# Patient Record
Sex: Male | Born: 1965 | Race: Black or African American | Hispanic: No | Smoking: Never smoker
Health system: Southern US, Community
[De-identification: ages and names within clinical notes are randomized; demographics above are authoritative.]

## PROBLEM LIST (undated history)

## (undated) DIAGNOSIS — B20 Human immunodeficiency virus [HIV] disease: Secondary | ICD-10-CM

## (undated) DIAGNOSIS — Z21 Asymptomatic human immunodeficiency virus [HIV] infection status: Secondary | ICD-10-CM

## (undated) HISTORY — PX: SKIN SURGERY: SHX2413

---

## 2004-12-31 ENCOUNTER — Emergency Department: Payer: Self-pay | Admitting: Emergency Medicine

## 2005-01-10 ENCOUNTER — Emergency Department: Payer: Self-pay | Admitting: Unknown Physician Specialty

## 2007-12-24 ENCOUNTER — Emergency Department: Payer: Self-pay | Admitting: Emergency Medicine

## 2010-10-27 ENCOUNTER — Ambulatory Visit: Payer: Self-pay | Admitting: Internal Medicine

## 2011-04-28 DIAGNOSIS — A630 Anogenital (venereal) warts: Secondary | ICD-10-CM | POA: Insufficient documentation

## 2012-11-21 DIAGNOSIS — E1121 Type 2 diabetes mellitus with diabetic nephropathy: Secondary | ICD-10-CM | POA: Insufficient documentation

## 2012-11-21 DIAGNOSIS — K6282 Dysplasia of anus: Secondary | ICD-10-CM | POA: Insufficient documentation

## 2012-11-21 DIAGNOSIS — I10 Essential (primary) hypertension: Secondary | ICD-10-CM | POA: Insufficient documentation

## 2012-11-21 DIAGNOSIS — B20 Human immunodeficiency virus [HIV] disease: Secondary | ICD-10-CM | POA: Insufficient documentation

## 2012-11-21 DIAGNOSIS — R748 Abnormal levels of other serum enzymes: Secondary | ICD-10-CM | POA: Insufficient documentation

## 2012-11-21 DIAGNOSIS — E669 Obesity, unspecified: Secondary | ICD-10-CM | POA: Insufficient documentation

## 2012-11-21 DIAGNOSIS — E785 Hyperlipidemia, unspecified: Secondary | ICD-10-CM | POA: Insufficient documentation

## 2012-11-27 DIAGNOSIS — Z0000 Encounter for general adult medical examination without abnormal findings: Secondary | ICD-10-CM | POA: Insufficient documentation

## 2015-04-26 DIAGNOSIS — N529 Male erectile dysfunction, unspecified: Secondary | ICD-10-CM | POA: Insufficient documentation

## 2015-10-29 DIAGNOSIS — E538 Deficiency of other specified B group vitamins: Secondary | ICD-10-CM | POA: Insufficient documentation

## 2016-10-04 DIAGNOSIS — H35033 Hypertensive retinopathy, bilateral: Secondary | ICD-10-CM | POA: Insufficient documentation

## 2017-03-01 ENCOUNTER — Encounter: Payer: Self-pay | Admitting: Urology

## 2017-03-01 ENCOUNTER — Ambulatory Visit (INDEPENDENT_AMBULATORY_CARE_PROVIDER_SITE_OTHER): Payer: BC Managed Care – PPO | Admitting: Urology

## 2017-03-01 VITALS — BP 129/79 | HR 102 | Ht 70.0 in | Wt 265.0 lb

## 2017-03-01 DIAGNOSIS — E291 Testicular hypofunction: Secondary | ICD-10-CM

## 2017-03-01 DIAGNOSIS — N529 Male erectile dysfunction, unspecified: Secondary | ICD-10-CM

## 2017-03-01 MED ORDER — SILDENAFIL CITRATE 20 MG PO TABS: ORAL_TABLET | ORAL | 2 refills | Status: DC

## 2017-03-01 MED ORDER — TESTOSTERONE CYPIONATE 200 MG/ML IM SOLN: 100.0000 mg | INTRAMUSCULAR | 0 refills | Status: DC

## 2017-03-01 NOTE — Progress Notes (Signed)
03/01/2017 2:34 PM   George Bush 1966/04/02 [id number]   Chief Complaint  Patient presents with  . Erectile Dysfunction    HPI: 51 year old male presents for follow-up of erectile dysfunction and hypogonadism.  He was last seen by me at [facility] in May 2018.  He is currently injecting 100 mg of testosterone weekly.  He gave himself an injection today.  He has noted improvement in his energy level and libido.  He is also taking generic sildenafil with improvement in his ED.  Denies breast tenderness/enlargement or lower extremity edema.  He has no bothersome lower urinary tract symptoms.    PMH: History reviewed. No pertinent past medical history.  Surgical History: History reviewed. No pertinent surgical history.  Home Medications:  Allergies as of 03/01/2017   No Known Allergies     Medication List        Accurate as of 03/01/17  2:34 PM. Always use your most recent med list.          aspirin EC 81 MG tablet Take 81 mg by mouth.   atorvastatin 40 MG tablet Commonly known as:  LIPITOR TAKE ONE TABLET EVERY DAY   elvitegravir-cobicistat-emtricitabine-tenofovir 150-150-200-10 MG Tabs tablet Commonly known as:  GENVOYA Take by mouth.   empagliflozin 25 MG Tabs tablet Commonly known as:  JARDIANCE Take 25 mg by mouth.   Insulin Degludec-Liraglutide 100-3.6 UNIT-MG/ML Sopn Inject into the skin.   lisinopril-hydrochlorothiazide 20-25 MG tablet Commonly known as:  PRINZIDE,ZESTORETIC Take by mouth.   metFORMIN 1000 MG tablet Commonly known as:  GLUCOPHAGE TAKE 1 TABLET BY MOUTH TWICE DAILY WITH A MEAL   naproxen 500 MG tablet Commonly known as:  NAPROSYN   sildenafil 20 MG tablet Commonly known as:  REVATIO 3-5 tablets 1 hour prior to intercourse   testosterone cypionate 200 MG/ML injection Commonly known as:  DEPOTESTOSTERONE CYPIONATE Inject every 14 (fourteen) days into the muscle.       Allergies: No Known Allergies  Family  History: History reviewed. No pertinent family history.  Social History:  reports that  has never smoked. he has never used smokeless tobacco. He reports that he does not drink alcohol or use drugs.  ROS: UROLOGY Frequent Urination?: No Hard to postpone urination?: No Burning/pain with urination?: No Get up at night to urinate?: No Leakage of urine?: No Urine stream starts and stops?: No Trouble starting stream?: No Do you have to strain to urinate?: No Blood in urine?: No Urinary tract infection?: No Sexually transmitted disease?: No Injury to kidneys or bladder?: No Painful intercourse?: No Weak stream?: No Erection problems?: Yes Penile pain?: No  Gastrointestinal Nausea?: No Vomiting?: No Indigestion/heartburn?: No Diarrhea?: No Constipation?: No  Constitutional Fever: No Night sweats?: No Weight loss?: No Fatigue?: No  Skin Skin rash/lesions?: No Itching?: No  Eyes Blurred vision?: No Double vision?: No  Ears/Nose/Throat Sore throat?: No Sinus problems?: No  Hematologic/Lymphatic Swollen glands?: No Easy bruising?: No  Cardiovascular Leg swelling?: No Chest pain?: No  Respiratory Cough?: No Shortness of breath?: No  Endocrine Excessive thirst?: No  Musculoskeletal Back pain?: No Joint pain?: No  Neurological Headaches?: No Dizziness?: No  Psychologic Depression?: No Anxiety?: No  Physical Exam: BP 129/79   Pulse (!) 102   Ht 5\' 10"  (1.778 m)   Wt 265 lb (120.2 kg)   BMI 38.02 kg/m   Constitutional:  Alert and oriented, No acute distress. HEENT: [place] AT, moist mucus membranes.  Trachea midline, no masses. Cardiovascular: No clubbing, cyanosis,  or edema. Respiratory: Normal respiratory effort, no increased work of breathing. GI: Abdomen is soft, nontender, nondistended, no abdominal masses GU: No CVA tenderness.  Prostate 40 g, smooth without nodules Skin: No rashes, bruises or suspicious lesions. Lymph: No cervical or  inguinal adenopathy. Neurologic: Grossly intact, no focal deficits, moving all 4 extremities. Psychiatric: Normal mood and affect.    Assessment & Plan:   1. Hypogonadism in male Currently doing well on testosterone replacement.  Monitoring blood work was drawn including PSA, testosterone and hematocrit.  Testosterone was refilled.  If stable he will follow-up in 6 months.  - PSA - Testosterone - Hematocrit  2.  Erectile dysfunction  Sildenafil was refilled.    Riki AltesScott C Gabriella Guile, MD  [facility] [address], [address] [place], [place] [address] [phone]

## 2017-03-02 LAB — TESTOSTERONE: TESTOSTERONE: 403 ng/dL (ref 264–916)

## 2017-03-02 LAB — PSA: PROSTATE SPECIFIC AG, SERUM: 0.7 ng/mL (ref 0.0–4.0)

## 2017-03-02 LAB — HEMATOCRIT: Hematocrit: 47.9 % (ref 37.5–51.0)

## 2017-03-08 ENCOUNTER — Telehealth: Payer: Self-pay

## 2017-03-08 NOTE — Telephone Encounter (Signed)
-----   Message from Riki AltesScott C Stoioff, MD sent at 03/04/2017  1:38 PM EST ----- Lab work looks good.  Testosterone level looks good at 403; PSA normal at 0.7 and hematocrit was normal.

## 2017-03-08 NOTE — Telephone Encounter (Signed)
LMOM- labs are normal. 

## 2017-08-29 ENCOUNTER — Ambulatory Visit: Payer: BC Managed Care – PPO | Admitting: Urology

## 2017-08-31 ENCOUNTER — Ambulatory Visit: Payer: BC Managed Care – PPO | Admitting: Urology

## 2017-10-10 ENCOUNTER — Ambulatory Visit (INDEPENDENT_AMBULATORY_CARE_PROVIDER_SITE_OTHER): Payer: BC Managed Care – PPO | Admitting: Urology

## 2017-10-10 ENCOUNTER — Encounter: Payer: Self-pay | Admitting: Urology

## 2017-10-10 VITALS — BP 122/77 | HR 98 | Resp 16 | Ht 71.0 in | Wt 269.5 lb

## 2017-10-10 DIAGNOSIS — E291 Testicular hypofunction: Secondary | ICD-10-CM | POA: Diagnosis not present

## 2017-10-10 DIAGNOSIS — N5201 Erectile dysfunction due to arterial insufficiency: Secondary | ICD-10-CM | POA: Insufficient documentation

## 2017-10-10 NOTE — Progress Notes (Signed)
10/10/2017 12:12 PM   George Bush 1965/11/10 [id number]  Referring provider: Gale Journey, MD [address] [place], [place] [address]  Chief Complaint  Patient presents with  . Follow-up    HPI: 52 year old male presents for follow-up of erectile dysfunction and hypogonadism.  He was last seen in November 2018.  He was injecting 100 mg weekly however states he discontinued testosterone replacement approximately 3 months ago as his pharmacy told him that we had discontinued the medication.  There is no indication in the record that it was recommended to discontinue TRT.  He remains on generic sildenafil with moderate improvement in his ED.   PMH: History reviewed. No pertinent past medical history.  Surgical History: History reviewed. No pertinent surgical history.  Home Medications:  Allergies as of 10/10/2017   No Known Allergies     Medication List        Accurate as of 10/10/17 12:12 PM. Always use your most recent med list.          aspirin EC 81 MG tablet Take 81 mg by mouth.   atorvastatin 40 MG tablet Commonly known as:  LIPITOR TAKE ONE TABLET EVERY DAY   elvitegravir-cobicistat-emtricitabine-tenofovir 150-150-200-10 MG Tabs tablet Commonly known as:  GENVOYA Take by mouth.   empagliflozin 25 MG Tabs tablet Commonly known as:  JARDIANCE Take 25 mg by mouth.   Insulin Degludec-Liraglutide 100-3.6 UNIT-MG/ML Sopn Inject into the skin.   lisinopril-hydrochlorothiazide 20-25 MG tablet Commonly known as:  PRINZIDE,ZESTORETIC Take by mouth.   metFORMIN 1000 MG tablet Commonly known as:  GLUCOPHAGE TAKE 1 TABLET BY MOUTH TWICE DAILY WITH A MEAL   naproxen 500 MG tablet Commonly known as:  NAPROSYN   sildenafil 20 MG tablet Commonly known as:  REVATIO 3-5 tablets 1 hour prior to intercourse       Allergies: No Known Allergies  Family History: History reviewed. No pertinent family history.  Social History:   reports that he has never smoked. He has never used smokeless tobacco. He reports that he does not drink alcohol or use drugs.  ROS: UROLOGY Frequent Urination?: No Hard to postpone urination?: No Burning/pain with urination?: No Get up at night to urinate?: No Leakage of urine?: No Urine stream starts and stops?: No Trouble starting stream?: No Do you have to strain to urinate?: No Blood in urine?: No Urinary tract infection?: No Sexually transmitted disease?: No Injury to kidneys or bladder?: No Painful intercourse?: No Weak stream?: No Erection problems?: Yes Penile pain?: No  Gastrointestinal Nausea?: No Vomiting?: No Indigestion/heartburn?: No Diarrhea?: No Constipation?: No  Constitutional Fever: No Night sweats?: No Weight loss?: No Fatigue?: No  Skin Skin rash/lesions?: No Itching?: No  Eyes Blurred vision?: No Double vision?: No  Ears/Nose/Throat Sore throat?: No Sinus problems?: No  Hematologic/Lymphatic Swollen glands?: No Easy bruising?: No  Cardiovascular Leg swelling?: No Chest pain?: No  Respiratory Cough?: No Shortness of breath?: No  Endocrine Excessive thirst?: No  Musculoskeletal Back pain?: No Joint pain?: No  Neurological Headaches?: No Dizziness?: No  Psychologic Depression?: No Anxiety?: No  Physical Exam: BP 122/77   Pulse 98   Resp 16   Ht 5\' 11"  (1.803 m)   Wt 269 lb 8 oz (122.2 kg)   SpO2 96%   BMI 37.59 kg/m   Constitutional:  Alert and oriented, No acute distress. HEENT: [place] AT, moist mucus membranes.  Trachea midline, no masses. Cardiovascular: No clubbing, cyanosis, or edema. Respiratory: Normal respiratory  effort, no increased work of breathing. GI: Abdomen is soft, nontender, nondistended, no abdominal masses GU: No CVA tenderness Lymph: No cervical or inguinal lymphadenopathy. Skin: No rashes, bruises or suspicious lesions. Neurologic: Grossly intact, no focal deficits, moving all 4  extremities. Psychiatric: Normal mood and affect.    Assessment & Plan:   52 year old male with erectile dysfunction and hypogonadism.  Will recheck his testosterone level and of low he was offered to go back on TRT.  Continue sildenafil.  He indicated he did not need a refill.   Riki AltesScott C Arlis Yale, MD  [facility] [address], [address] [place], [place] [phone]

## 2017-10-11 ENCOUNTER — Telehealth: Payer: Self-pay

## 2017-10-11 LAB — TESTOSTERONE: TESTOSTERONE: 124 ng/dL — AB (ref 264–916)

## 2017-10-11 NOTE — Telephone Encounter (Signed)
-----   Message from Riki AltesScott C Stoioff, MD sent at 10/11/2017  7:48 AM EDT ----- Testosterone level was low at 124.  Does he want to go back on testosterone replacement?

## 2017-10-11 NOTE — Telephone Encounter (Signed)
Called pt informed him of the information below. Pt gave verbal understanding, states that he would like to resume testosterone replacement. [facility].

## 2017-10-13 MED ORDER — TESTOSTERONE CYPIONATE 200 MG/ML IM SOLN: 100.0000 mg | INTRAMUSCULAR | 0 refills | Status: DC

## 2017-10-13 NOTE — Telephone Encounter (Signed)
Called pt, no answer. LM for pt informing him of the information below. Advised pt to call back for any questions or concerns.

## 2017-10-13 NOTE — Telephone Encounter (Signed)
Rx was sent.  Recommend a testosterone level in 6 to 8 weeks after he resumes therapy.

## 2017-10-25 ENCOUNTER — Other Ambulatory Visit: Payer: Self-pay | Admitting: Family Medicine

## 2017-10-26 ENCOUNTER — Other Ambulatory Visit: Payer: Self-pay | Admitting: Family Medicine

## 2017-11-04 ENCOUNTER — Ambulatory Visit (INDEPENDENT_AMBULATORY_CARE_PROVIDER_SITE_OTHER): Payer: BC Managed Care – PPO | Admitting: Family Medicine

## 2017-11-04 DIAGNOSIS — E291 Testicular hypofunction: Secondary | ICD-10-CM

## 2017-11-04 MED ORDER — TESTOSTERONE CYPIONATE 200 MG/ML IM SOLN
200.0000 mg | Freq: Once | INTRAMUSCULAR | Status: AC
  Administered 2017-11-04: 200 mg via INTRAMUSCULAR

## 2017-11-04 NOTE — Progress Notes (Signed)
Testosterone IM Injection  Due to Hypogonadism patient is present today for a Testosterone Injection.  Medication: Testosterone Cypionate Dose: 0.5ML Location: right upper outer buttocks Lot: [id number] Exp:07/2018  Patient tolerated well, no complications were noted  Preformed by: Teressa Lowerarrie Mattox Schorr, CMA  Follow up: 1 week

## 2017-11-07 ENCOUNTER — Other Ambulatory Visit: Payer: Self-pay | Admitting: Urology

## 2017-11-07 MED ORDER — SILDENAFIL CITRATE 20 MG PO TABS: ORAL_TABLET | ORAL | 2 refills | Status: DC

## 2017-11-14 ENCOUNTER — Other Ambulatory Visit: Payer: Self-pay

## 2017-11-14 ENCOUNTER — Ambulatory Visit (INDEPENDENT_AMBULATORY_CARE_PROVIDER_SITE_OTHER): Payer: BC Managed Care – PPO

## 2017-11-14 DIAGNOSIS — E291 Testicular hypofunction: Secondary | ICD-10-CM

## 2017-11-14 MED ORDER — TESTOSTERONE CYPIONATE 200 MG/ML IM SOLN
100.0000 mg | Freq: Once | INTRAMUSCULAR | Status: AC
  Administered 2017-11-14: 100 mg via INTRAMUSCULAR

## 2017-11-14 NOTE — Progress Notes (Signed)
Testosterone IM Injection  Due to Hypogonadism patient is present today for a Testosterone Injection.  Medication: Testosterone Cypionate Dose: 0.595ml Location: left upper outer buttocks Lot: [id number] Exp:07/2018  Patient tolerated well, no complications were noted  Preformed by: Nydia Boutonhristopher Valente Fosberg, CMA  Follow up: 1 week

## 2017-11-21 ENCOUNTER — Other Ambulatory Visit: Payer: Self-pay

## 2017-11-21 ENCOUNTER — Ambulatory Visit: Payer: BC Managed Care – PPO

## 2017-11-21 NOTE — Progress Notes (Signed)
Pt came in office today for nurse visit to receive his testosterone injection. Pt is currently out of medication in office and did not bring any with him. He will come in tomorrow with new doses and receive his injection at that time.

## 2017-11-22 ENCOUNTER — Ambulatory Visit (INDEPENDENT_AMBULATORY_CARE_PROVIDER_SITE_OTHER): Payer: BC Managed Care – PPO | Admitting: Family Medicine

## 2017-11-22 DIAGNOSIS — E291 Testicular hypofunction: Secondary | ICD-10-CM

## 2017-11-22 MED ORDER — TESTOSTERONE CYPIONATE 200 MG/ML IM SOLN
200.0000 mg | Freq: Once | INTRAMUSCULAR | Status: AC
  Administered 2017-11-22: 200 mg via INTRAMUSCULAR

## 2017-11-22 NOTE — Progress Notes (Signed)
Testosterone IM Injection  Due to Hypogonadism patient is present today for a Testosterone Injection.  Medication: Testosterone Cypionate Dose: 0.5ML Location: right upper outer buttocks Lot: [id number] Exp:07/2018  Patient tolerated well, no complications were noted  Preformed by: Teressa Lowerarrie Garrison, CMA  Follow up: 1 week

## 2017-11-29 ENCOUNTER — Ambulatory Visit (INDEPENDENT_AMBULATORY_CARE_PROVIDER_SITE_OTHER): Payer: BC Managed Care – PPO

## 2017-11-29 DIAGNOSIS — E291 Testicular hypofunction: Secondary | ICD-10-CM | POA: Diagnosis not present

## 2017-11-29 MED ORDER — TESTOSTERONE CYPIONATE 200 MG/ML IM SOLN
100.0000 mg | Freq: Once | INTRAMUSCULAR | Status: AC
  Administered 2017-11-29: 100 mg via INTRAMUSCULAR

## 2017-11-29 NOTE — Progress Notes (Signed)
Testosterone IM Injection  Due to Hypogonadism patient is present today for a Testosterone Injection.  Medication: Testosterone Cypionate Dose: .0.5 Location: left upper outer buttocks Lot: [id number] Exp:07/2018  Patient tolerated well, no complications were noted  Preformed by: Nydia Boutonhristopher Elizabet Schweppe, CMA  Follow up: As Scheduled

## 2017-12-06 ENCOUNTER — Ambulatory Visit (INDEPENDENT_AMBULATORY_CARE_PROVIDER_SITE_OTHER): Payer: BC Managed Care – PPO

## 2017-12-06 DIAGNOSIS — E291 Testicular hypofunction: Secondary | ICD-10-CM | POA: Diagnosis not present

## 2017-12-06 MED ORDER — TESTOSTERONE CYPIONATE 200 MG/ML IM SOLN
100.0000 mg | Freq: Once | INTRAMUSCULAR | Status: AC
  Administered 2017-12-06: 100 mg via INTRAMUSCULAR

## 2017-12-06 NOTE — Progress Notes (Signed)
Testosterone IM Injection  Due to Hypogonadism patient is present today for a Testosterone Injection.  Medication: Testosterone Cypionate Dose: .5ml Location: right upper outer buttocks Lot: [id number] Exp:05/2019  Patient tolerated well, no complications were noted  Preformed by: Nydia Boutonhristopher Thompson, CMA  Follow up: As Scheduled

## 2017-12-13 ENCOUNTER — Ambulatory Visit (INDEPENDENT_AMBULATORY_CARE_PROVIDER_SITE_OTHER): Payer: BC Managed Care – PPO

## 2017-12-13 DIAGNOSIS — E291 Testicular hypofunction: Secondary | ICD-10-CM | POA: Diagnosis not present

## 2017-12-13 MED ORDER — TESTOSTERONE CYPIONATE 200 MG/ML IM SOLN
100.0000 mg | Freq: Once | INTRAMUSCULAR | Status: AC
  Administered 2017-12-13: 100 mg via INTRAMUSCULAR

## 2017-12-13 MED ORDER — TESTOSTERONE CYPIONATE 200 MG/ML IM SOLN: 200.0000 mg | Freq: Once | INTRAMUSCULAR | Status: DC

## 2017-12-13 NOTE — Progress Notes (Signed)
Testosterone IM Injection  Due to Hypogonadism patient is present today for a Testosterone Injection.  Medication: Testosterone Cypionate Dose: 0.575mL Location: right upper outer buttocks Lot: [id number] Exp:05/2019  Patient tolerated well, no complications were noted  Preformed by: Debbe Baleskinawa Tamalyn Wadsworth, CMA (AAMA)  Follow up: 6-8wks as scheduled for repeat lab

## 2017-12-28 ENCOUNTER — Other Ambulatory Visit: Payer: Self-pay

## 2017-12-28 DIAGNOSIS — E291 Testicular hypofunction: Secondary | ICD-10-CM

## 2017-12-30 ENCOUNTER — Other Ambulatory Visit: Payer: BC Managed Care – PPO

## 2017-12-30 DIAGNOSIS — E291 Testicular hypofunction: Secondary | ICD-10-CM

## 2017-12-31 LAB — TESTOSTERONE: TESTOSTERONE: 92 ng/dL — AB (ref 264–916)

## 2018-01-02 ENCOUNTER — Telehealth: Payer: Self-pay

## 2018-01-02 MED ORDER — TESTOSTERONE CYPIONATE 200 MG/ML IM SOLN: 100.0000 mg | INTRAMUSCULAR | 0 refills | Status: DC

## 2018-01-02 NOTE — Telephone Encounter (Signed)
RX printed and faxed to [facility]

## 2018-01-02 NOTE — Telephone Encounter (Signed)
Spoke to patient and advised him of his testosterone results.  He would like the new Rx to be sent to [facility].  He scheduled a testosterone injection for next Wednesday, 01/11/18.

## 2018-01-02 NOTE — Telephone Encounter (Signed)
Left pt mess to call 

## 2018-01-02 NOTE — Telephone Encounter (Signed)
-----   Message from Riki Altes, MD sent at 01/01/2018 10:33 AM EDT ----- Testosterone level remains low at 92 however it looks like his last injection was on 8/20.  If he is going to have injections in the office it may be better to change to 200 mg every 2 weeks.  Would recommend a repeat testosterone level 6 to 8 weeks after he has been on continuous therapy.

## 2018-01-11 ENCOUNTER — Ambulatory Visit: Payer: BC Managed Care – PPO

## 2018-01-11 DIAGNOSIS — E291 Testicular hypofunction: Secondary | ICD-10-CM

## 2018-01-11 MED ORDER — TESTOSTERONE CYPIONATE 200 MG/ML IM SOLN: 100.0000 mg | Freq: Once | INTRAMUSCULAR | Status: DC

## 2018-01-11 NOTE — Progress Notes (Signed)
Testosterone IM Injection  Due to Hypogonadism patient is present today for a Testosterone Injection.  Medication: Testosterone Cypionate Dose: 0.5 Location: left upper outer buttocks  Patient tolerated well, no complications were noted  Preformed by: Nydia Boutonhristopher Thompson, CMA  Follow up: As Scheduled

## 2018-01-18 ENCOUNTER — Ambulatory Visit (INDEPENDENT_AMBULATORY_CARE_PROVIDER_SITE_OTHER): Payer: BC Managed Care – PPO | Admitting: Family Medicine

## 2018-01-18 DIAGNOSIS — E291 Testicular hypofunction: Secondary | ICD-10-CM

## 2018-01-18 MED ORDER — TESTOSTERONE CYPIONATE 200 MG/ML IM SOLN
200.0000 mg | Freq: Once | INTRAMUSCULAR | Status: AC
  Administered 2018-01-18: 200 mg via INTRAMUSCULAR

## 2018-01-18 NOTE — Progress Notes (Signed)
Testosterone IM Injection  Due to Hypogonadism patient is present today for a Testosterone Injection.  Medication: Testosterone Cypionate Dose: 0.5ml Location: right upper outer buttocks Lot: [id number] Exp:06/2019  Patient tolerated well, no complications were noted  Preformed by: Carrie Garrison, CMA  Follow up: 1 week  

## 2018-01-25 ENCOUNTER — Ambulatory Visit (INDEPENDENT_AMBULATORY_CARE_PROVIDER_SITE_OTHER): Payer: BC Managed Care – PPO | Admitting: Family Medicine

## 2018-01-25 DIAGNOSIS — E291 Testicular hypofunction: Secondary | ICD-10-CM | POA: Diagnosis not present

## 2018-01-25 MED ORDER — TESTOSTERONE CYPIONATE 200 MG/ML IM SOLN
200.0000 mg | Freq: Once | INTRAMUSCULAR | Status: AC
  Administered 2018-01-25: 200 mg via INTRAMUSCULAR

## 2018-01-25 NOTE — Progress Notes (Signed)
Testosterone IM Injection  Due to Hypogonadism patient is present today for a Testosterone Injection.  Medication: Testosterone Cypionate Dose: 0.5ML Location: left upper outer buttocks Lot: [id number] Exp:06/2019  Patient tolerated well, no complications were noted  Preformed by: Tristan Bramble, CMA  Follow up: 1 week 

## 2018-02-01 ENCOUNTER — Ambulatory Visit (INDEPENDENT_AMBULATORY_CARE_PROVIDER_SITE_OTHER): Payer: BC Managed Care – PPO

## 2018-02-01 DIAGNOSIS — E291 Testicular hypofunction: Secondary | ICD-10-CM | POA: Diagnosis not present

## 2018-02-01 MED ORDER — TESTOSTERONE CYPIONATE 200 MG/ML IM SOLN
100.0000 mg | Freq: Once | INTRAMUSCULAR | Status: AC
  Administered 2018-02-01: 100 mg via INTRAMUSCULAR

## 2018-02-01 NOTE — Progress Notes (Signed)
Testosterone IM Injection  Due to Hypogonadism patient is present today for a Testosterone Injection.  Medication: Testosterone Cypionate Dose: 0.5 Location: right upper outer buttocks Lot: [id number] Exp:06/2019  Patient tolerated well, no complications were noted  Preformed by: Nydia Bouton, CMA  Follow up: as scheduled

## 2018-02-07 ENCOUNTER — Ambulatory Visit: Payer: BC Managed Care – PPO

## 2018-02-08 ENCOUNTER — Ambulatory Visit (INDEPENDENT_AMBULATORY_CARE_PROVIDER_SITE_OTHER): Payer: BC Managed Care – PPO | Admitting: Family Medicine

## 2018-02-08 DIAGNOSIS — E291 Testicular hypofunction: Secondary | ICD-10-CM | POA: Diagnosis not present

## 2018-02-08 MED ORDER — TESTOSTERONE CYPIONATE 200 MG/ML IM SOLN
200.0000 mg | Freq: Once | INTRAMUSCULAR | Status: AC
  Administered 2018-02-08: 200 mg via INTRAMUSCULAR

## 2018-02-08 NOTE — Progress Notes (Signed)
Testosterone IM Injection  Due to Hypogonadism patient is present today for a Testosterone Injection.  Medication: Testosterone Cypionate Dose: 0.5ML Location: left upper outer buttocks Lot: [id number] Exp:06/2019  Patient tolerated well, no complications were noted  Preformed by: Teressa Lower, CMA  Follow up: 1 week

## 2018-02-15 ENCOUNTER — Ambulatory Visit (INDEPENDENT_AMBULATORY_CARE_PROVIDER_SITE_OTHER): Payer: BC Managed Care – PPO | Admitting: Family Medicine

## 2018-02-15 DIAGNOSIS — E291 Testicular hypofunction: Secondary | ICD-10-CM

## 2018-02-15 MED ORDER — TESTOSTERONE CYPIONATE 200 MG/ML IM SOLN
200.0000 mg | Freq: Once | INTRAMUSCULAR | Status: AC
  Administered 2018-02-15: 100 mg via INTRAMUSCULAR

## 2018-02-15 NOTE — Progress Notes (Signed)
Testosterone IM Injection  Due to Hypogonadism patient is present today for a Testosterone Injection.  Medication: Testosterone Cypionate Dose: 0.5ml Location: right upper outer buttocks Lot: [id number] Exp:06/2019  Patient tolerated well, no complications were noted  Preformed by: Natalie Leclaire, CMA  Follow up: 1 week  

## 2018-02-21 ENCOUNTER — Ambulatory Visit (INDEPENDENT_AMBULATORY_CARE_PROVIDER_SITE_OTHER): Payer: BC Managed Care – PPO | Admitting: Family Medicine

## 2018-02-21 DIAGNOSIS — E291 Testicular hypofunction: Secondary | ICD-10-CM

## 2018-02-21 MED ORDER — TESTOSTERONE CYPIONATE 200 MG/ML IM SOLN
100.0000 mg | Freq: Once | INTRAMUSCULAR | Status: AC
  Administered 2018-02-21: 100 mg via INTRAMUSCULAR

## 2018-02-21 NOTE — Progress Notes (Signed)
Testosterone IM Injection  Due to Hypogonadism patient is present today for a Testosterone Injection.  Medication: Testosterone Cypionate Dose: 0.5ML Location: left upper outer buttocks Lot: [id number] Exp:06/2019  Patient tolerated well, no complications were noted  Preformed by: Teressa Lower, CMA  Follow up: 1 week

## 2018-02-28 ENCOUNTER — Telehealth: Payer: Self-pay | Admitting: Urology

## 2018-02-28 ENCOUNTER — Ambulatory Visit (INDEPENDENT_AMBULATORY_CARE_PROVIDER_SITE_OTHER): Payer: BC Managed Care – PPO | Admitting: Family Medicine

## 2018-02-28 DIAGNOSIS — E291 Testicular hypofunction: Secondary | ICD-10-CM | POA: Diagnosis not present

## 2018-02-28 MED ORDER — TESTOSTERONE CYPIONATE 200 MG/ML IM SOLN
100.0000 mg | Freq: Once | INTRAMUSCULAR | Status: AC
  Administered 2018-02-28: 100 mg via INTRAMUSCULAR

## 2018-02-28 NOTE — Telephone Encounter (Signed)
Please contact patient regarding this medication.

## 2018-02-28 NOTE — Progress Notes (Signed)
Testosterone IM Injection  Due to Hypogonadism patient is present today for a Testosterone Injection.  Medication: Testosterone Cypionate Dose: 0.5ML Location: right upper outer buttocks Lot: [id number] Exp:06/2019  Patient tolerated well, no complications were noted  Preformed by: Jenissa Tyrell, CMA  Follow up: 1 week 

## 2018-02-28 NOTE — Telephone Encounter (Signed)
Pt at check out asking to speak to stoioff or have him call him about Cialis from [place], Pt would like for you to call him at [phone],

## 2018-03-02 ENCOUNTER — Ambulatory Visit: Payer: BC Managed Care – PPO

## 2018-03-02 NOTE — Addendum Note (Signed)
Addended by: Frankey Shown on: 03/02/2018 04:23 PM   Modules accepted: Orders

## 2018-03-02 NOTE — Telephone Encounter (Signed)
Pt calls and states that you discussed him being changed to generic Cialis going forward as sildenafil has not worked well for him. Pt would like this sent into [facility] as soon as possible please. Please advise.

## 2018-03-03 MED ORDER — TADALAFIL 20 MG PO TABS: 10.0000 mg | ORAL_TABLET | Freq: Every day | ORAL | 0 refills | Status: DC | PRN

## 2018-03-03 NOTE — Addendum Note (Signed)
Addended by: Riki Altes on: 03/03/2018 02:24 PM   Modules accepted: Orders

## 2018-03-03 NOTE — Telephone Encounter (Signed)
Rx sent 

## 2018-03-06 ENCOUNTER — Ambulatory Visit (INDEPENDENT_AMBULATORY_CARE_PROVIDER_SITE_OTHER): Payer: BC Managed Care – PPO

## 2018-03-06 DIAGNOSIS — E291 Testicular hypofunction: Secondary | ICD-10-CM | POA: Diagnosis not present

## 2018-03-06 MED ORDER — TESTOSTERONE CYPIONATE 200 MG/ML IM SOLN
100.0000 mg | Freq: Once | INTRAMUSCULAR | Status: AC
  Administered 2018-03-06: 100 mg via INTRAMUSCULAR

## 2018-03-06 NOTE — Progress Notes (Signed)
Testosterone IM Injection  Due to Hypogonadism patient is present today for a Testosterone Injection.  Medication: Testosterone Cypionate Dose: 0.48ml Location: right upper outer buttocks Lot: [id number] Exp:06/2019  Patient tolerated well, no complications were noted  Preformed by: Darrol Angel, CMA(SSMS)  Follow up: 1 week

## 2018-03-14 ENCOUNTER — Ambulatory Visit (INDEPENDENT_AMBULATORY_CARE_PROVIDER_SITE_OTHER): Payer: BC Managed Care – PPO | Admitting: Family Medicine

## 2018-03-14 DIAGNOSIS — E291 Testicular hypofunction: Secondary | ICD-10-CM

## 2018-03-14 MED ORDER — TESTOSTERONE CYPIONATE 200 MG/ML IM SOLN
200.0000 mg | Freq: Once | INTRAMUSCULAR | Status: AC
  Administered 2018-03-14: 200 mg via INTRAMUSCULAR

## 2018-03-14 NOTE — Progress Notes (Signed)
Testosterone IM Injection  Due to Hypogonadism patient is present today for a Testosterone Injection.  Medication: Testosterone Cypionate Dose: 0.5ML Location: right upper outer buttocks Lot: [id number] Exp:06/2019  Patient tolerated well, no complications were noted  Preformed by: Teressa Lowerarrie Garrison, CMA  Follow up: 1 week

## 2018-03-22 ENCOUNTER — Ambulatory Visit (INDEPENDENT_AMBULATORY_CARE_PROVIDER_SITE_OTHER): Payer: BC Managed Care – PPO

## 2018-03-22 DIAGNOSIS — E291 Testicular hypofunction: Secondary | ICD-10-CM

## 2018-03-22 MED ORDER — TESTOSTERONE CYPIONATE 200 MG/ML IM SOLN
100.0000 mg | Freq: Once | INTRAMUSCULAR | Status: AC
  Administered 2018-03-22: 100 mg via INTRAMUSCULAR

## 2018-03-22 NOTE — Progress Notes (Signed)
Testosterone IM Injection  Due to Hypogonadism patient is present today for a Testosterone Injection.  Medication: Testosterone Cypionate Dose: 0.5 Location: right upper outer buttocks Lot: [id number] Exp:06/2019  Patient tolerated well, no complications were noted  Preformed by: Eligha BridegroomSarah Sharan Mcenaney, CMA  Follow up: 1 week

## 2018-03-28 ENCOUNTER — Ambulatory Visit (INDEPENDENT_AMBULATORY_CARE_PROVIDER_SITE_OTHER): Payer: BC Managed Care – PPO | Admitting: Urology

## 2018-03-28 ENCOUNTER — Encounter: Payer: Self-pay | Admitting: Family Medicine

## 2018-03-28 DIAGNOSIS — E291 Testicular hypofunction: Secondary | ICD-10-CM

## 2018-03-28 MED ORDER — TESTOSTERONE CYPIONATE 200 MG/ML IM SOLN
200.0000 mg | Freq: Once | INTRAMUSCULAR | Status: AC
  Administered 2018-03-28: 200 mg via INTRAMUSCULAR

## 2018-03-28 NOTE — Progress Notes (Signed)
Testosterone IM Injection  Due to Hypogonadism patient is present today for a Testosterone Injection.  Medication: Testosterone Cypionate Dose: 0.5ML Location: left upper outer buttocks Lot: [id number] Exp:06/2019  Patient tolerated well, no complications were noted  Preformed by: Lyla Sonarrie garrison, CMA  Follow up: 1 week

## 2018-03-29 LAB — PSA: Prostate Specific Ag, Serum: 0.6 ng/mL (ref 0.0–4.0)

## 2018-03-29 LAB — HEMOGLOBIN: Hemoglobin: 16 g/dL (ref 13.0–17.7)

## 2018-03-29 LAB — TESTOSTERONE: Testosterone: 506 ng/dL (ref 264–916)

## 2018-03-29 LAB — HEMATOCRIT: Hematocrit: 46.6 % (ref 37.5–51.0)

## 2018-04-04 ENCOUNTER — Ambulatory Visit (INDEPENDENT_AMBULATORY_CARE_PROVIDER_SITE_OTHER): Payer: BC Managed Care – PPO | Admitting: Urology

## 2018-04-04 DIAGNOSIS — E291 Testicular hypofunction: Secondary | ICD-10-CM | POA: Diagnosis not present

## 2018-04-04 MED ORDER — TESTOSTERONE CYPIONATE 200 MG/ML IM SOLN
100.0000 mg | Freq: Once | INTRAMUSCULAR | Status: AC
  Administered 2018-04-04: 100 mg via INTRAMUSCULAR

## 2018-04-04 NOTE — Progress Notes (Signed)
Testosterone IM Injection  Due to Hypogonadism patient is present today for a Testosterone Injection.  Medication: Testosterone Cypionate Dose: 0.865ml Location: right upper outer buttocks Lot: [id number] Exp:06/2019  Patient tolerated well, no complications were noted  Preformed by: Teressa Lowerarrie Garrison, CMA  Follow up: 1 week

## 2018-04-12 ENCOUNTER — Ambulatory Visit (INDEPENDENT_AMBULATORY_CARE_PROVIDER_SITE_OTHER): Payer: BC Managed Care – PPO | Admitting: Urology

## 2018-04-12 DIAGNOSIS — E291 Testicular hypofunction: Secondary | ICD-10-CM | POA: Diagnosis not present

## 2018-04-12 MED ORDER — TESTOSTERONE CYPIONATE 200 MG/ML IM SOLN
100.0000 mg | Freq: Once | INTRAMUSCULAR | Status: AC
  Administered 2018-04-12: 100 mg via INTRAMUSCULAR

## 2018-04-12 NOTE — Progress Notes (Signed)
[place]

## 2018-04-12 NOTE — Progress Notes (Signed)
Testosterone IM Injection  Due to Hypogonadism patient is present today for a Testosterone Injection.  Medication: Testosterone Cypionate Dose: 0.5 ml Location: left upper outer buttocks Lot: [id number] Exp:06/2019  Patient tolerated well, no complications were noted  Preformed by: Jasmine, RMA  Follow up: 1 week

## 2018-04-27 ENCOUNTER — Ambulatory Visit (INDEPENDENT_AMBULATORY_CARE_PROVIDER_SITE_OTHER): Payer: BC Managed Care – PPO | Admitting: Urology

## 2018-04-27 ENCOUNTER — Encounter: Payer: Self-pay | Admitting: Urology

## 2018-04-27 VITALS — BP 118/77 | HR 84 | Ht 70.5 in | Wt 267.0 lb

## 2018-04-27 DIAGNOSIS — N5201 Erectile dysfunction due to arterial insufficiency: Secondary | ICD-10-CM

## 2018-04-27 DIAGNOSIS — E291 Testicular hypofunction: Secondary | ICD-10-CM | POA: Diagnosis not present

## 2018-04-27 MED ORDER — TESTOSTERONE CYPIONATE 200 MG/ML IM SOLN: 200.0000 mg | INTRAMUSCULAR | 0 refills | Status: DC

## 2018-04-27 MED ORDER — TESTOSTERONE CYPIONATE 200 MG/ML IM SOLN
200.0000 mg | Freq: Once | INTRAMUSCULAR | Status: AC
  Administered 2018-04-27: 200 mg via INTRAMUSCULAR

## 2018-04-27 NOTE — Progress Notes (Signed)
Testosterone IM Injection  Due to Hypogonadism patient is present today for a Testosterone Injection.  Medication: Testosterone Cypionate Dose: 21mL (per Dr.Stoioff) Location: right upper outer buttocks Lot: [id number] Exp:06/2019  Patient tolerated well, no complications were noted  Preformed by: Debbe Bales, CMA (AAMA)  Follow up: Every 2 weeks for 74mL Injection

## 2018-04-27 NOTE — Progress Notes (Signed)
04/27/2018 10:05 AM   Gregery Na Jun 08, 1965 [id number]  Referring provider: Gale Journey, MD [address] [place], [place] [address]  Chief Complaint  Patient presents with  . Follow-up   Urologic history: 1.  Hypogonadism  -On TRT; testosterone cypionate  2.  Erectile dysfunction  -Generic tadalafil  HPI: 53 year old male presents for a semiannual follow-up.  He was restarted on TRT in July 2019.  He wanted to learn injections however had a hematoma in the thigh and elected to have his injections performed in the office.  He has been on a weekly schedule however it is been more difficult for him to get into the office for regular injections.  He had blood work in early December and his testosterone level was 506; PSA stable at 0.6 and hematocrit 46.6.  He denies bothersome lower urinary tract symptoms, lower extremity edema or breast tenderness/enlargement.  He is on generic tadalafil for ED with good efficacy.  His last injection was on 04/12/2018.   PMH: No past medical history on file.  Surgical History: No past surgical history on file.  Home Medications:  Allergies as of 04/27/2018   No Known Allergies     Medication List       Accurate as of April 27, 2018 10:05 AM. Always use your most recent med list.        aspirin EC 81 MG tablet Take 81 mg by mouth.   atorvastatin 40 MG tablet Commonly known as:  LIPITOR TAKE ONE TABLET EVERY DAY   elvitegravir-cobicistat-emtricitabine-tenofovir 150-150-200-10 MG Tabs tablet Commonly known as:  GENVOYA Take by mouth.   empagliflozin 25 MG Tabs tablet Commonly known as:  JARDIANCE Take 25 mg by mouth.   Insulin Degludec-Liraglutide 100-3.6 UNIT-MG/ML Sopn Inject into the skin.   lisinopril-hydrochlorothiazide 20-25 MG tablet Commonly known as:  PRINZIDE,ZESTORETIC Take by mouth.   metFORMIN 1000 MG tablet Commonly known as:  GLUCOPHAGE TAKE 1 TABLET BY MOUTH TWICE DAILY WITH A  MEAL   naproxen 500 MG tablet Commonly known as:  NAPROSYN   tadalafil 20 MG tablet Commonly known as:  ADCIRCA/CIALIS Take 0.5 tablets (10 mg total) by mouth daily as needed for erectile dysfunction.   testosterone cypionate 200 MG/ML injection Commonly known as:  DEPOTESTOSTERONE CYPIONATE Inject 0.5 mLs (100 mg total) into the muscle once a week.       Allergies: No Known Allergies  Family History: No family history on file.  Social History:  reports that he has never smoked. He has never used smokeless tobacco. He reports that he does not drink alcohol or use drugs.  ROS: No significant change from 10/09/2017 with exception of provement in his low T symptoms.  Physical Exam: BP 118/77 (BP Location: Left Arm, Patient Position: Sitting, Cuff Size: Large)   Pulse 84   Ht 5' 10.5" (1.791 m)   Wt 267 lb (121.1 kg)   BMI 37.77 kg/m   Constitutional:  Alert and oriented, No acute distress. HEENT: [place] AT, moist mucus membranes.  Trachea midline, no masses. Cardiovascular: No clubbing, cyanosis, or edema. Respiratory: Normal respiratory effort, no increased work of breathing. GI: Abdomen is soft, nontender, nondistended, no abdominal masses GU: No CVA tenderness; prostate 30 g, smooth without nodules Lymph: No cervical or inguinal lymphadenopathy. Skin: No rashes, bruises or suspicious lesions. Neurologic: Grossly intact, no focal deficits, moving all 4 extremities. Psychiatric: Normal mood and affect.   Assessment & Plan:   53 year old male with hypogonadism  and erectile dysfunction.  Since it has been difficult for him to get to the office for weekly injections will change to 200 mg every 2 weeks.  He received a 200 mg injection today.  Testosterone was refilled.  Recommend a testosterone level and hematocrit in 6 months and follow-up in 1 year for PSA, testosterone, hematocrit and DRE.   Riki Altes, MD  [facility] [address]  [address] [place], [place] [address] [phone]

## 2018-04-28 ENCOUNTER — Ambulatory Visit: Payer: BC Managed Care – PPO | Admitting: Urology

## 2018-05-10 ENCOUNTER — Ambulatory Visit: Payer: BC Managed Care – PPO

## 2018-05-16 ENCOUNTER — Ambulatory Visit (INDEPENDENT_AMBULATORY_CARE_PROVIDER_SITE_OTHER): Payer: BC Managed Care – PPO

## 2018-05-16 DIAGNOSIS — N5201 Erectile dysfunction due to arterial insufficiency: Secondary | ICD-10-CM | POA: Diagnosis not present

## 2018-05-16 DIAGNOSIS — E291 Testicular hypofunction: Secondary | ICD-10-CM | POA: Diagnosis not present

## 2018-05-16 MED ORDER — TESTOSTERONE CYPIONATE 200 MG/ML IM SOLN
200.0000 mg | Freq: Once | INTRAMUSCULAR | Status: AC
  Administered 2018-05-16: 200 mg via INTRAMUSCULAR

## 2018-05-16 NOTE — Progress Notes (Signed)
Testosterone IM Injection  Due to Hypogonadism patient is present today for a Testosterone Injection.  Medication: Testosterone Cypionate Dose:  76mL Location: left upper outer buttocks Lot: 23803.015A Exp:12/2019  Patient tolerated well, no complications were noted  Preformed by: Debbe Bales, CMA (AAMA)  Follow up: In 2 weeks as scheduled

## 2018-05-30 ENCOUNTER — Ambulatory Visit: Payer: BC Managed Care – PPO

## 2018-05-31 ENCOUNTER — Other Ambulatory Visit: Payer: Self-pay | Admitting: Family Medicine

## 2018-05-31 ENCOUNTER — Ambulatory Visit (INDEPENDENT_AMBULATORY_CARE_PROVIDER_SITE_OTHER): Payer: BC Managed Care – PPO | Admitting: Family Medicine

## 2018-05-31 DIAGNOSIS — E291 Testicular hypofunction: Secondary | ICD-10-CM | POA: Diagnosis not present

## 2018-05-31 MED ORDER — TESTOSTERONE CYPIONATE 200 MG/ML IM SOLN
200.0000 mg | Freq: Once | INTRAMUSCULAR | Status: AC
  Administered 2018-05-31: 200 mg via INTRAMUSCULAR

## 2018-05-31 MED ORDER — SILDENAFIL CITRATE 20 MG PO TABS: ORAL_TABLET | ORAL | 2 refills | Status: DC

## 2018-05-31 NOTE — Progress Notes (Signed)
Testosterone IM Injection  Due to Hypogonadism patient is present today for a Testosterone Injection.  Medication: Testosterone Cypionate Dose: 36ml Location: right upper outer buttocks Lot: 23803.015A Exp:12/2019  Patient tolerated well, no complications were noted  Preformed by: Teressa Lower, CMA  Follow up: 2 weeks

## 2018-06-14 ENCOUNTER — Ambulatory Visit (INDEPENDENT_AMBULATORY_CARE_PROVIDER_SITE_OTHER): Payer: BC Managed Care – PPO

## 2018-06-14 DIAGNOSIS — E291 Testicular hypofunction: Secondary | ICD-10-CM | POA: Diagnosis not present

## 2018-06-14 MED ORDER — TESTOSTERONE CYPIONATE 200 MG/ML IM SOLN
200.0000 mg | Freq: Once | INTRAMUSCULAR | Status: AC
  Administered 2018-06-14: 200 mg via INTRAMUSCULAR

## 2018-06-14 NOTE — Progress Notes (Signed)
Testosterone IM Injection  Due to Hypogonadism patient is present today for a Testosterone Injection.  Medication: Testosterone Cypionate Dose: 65mL Location: right upper outer buttocks Lot: 23803.015A Exp:12/2019  Patient tolerated well, no complications were noted  Preformed by: Debbe Bales, CMA (AAMA) Follow up: RTC in 2 weeks for next injection

## 2018-06-28 ENCOUNTER — Ambulatory Visit (INDEPENDENT_AMBULATORY_CARE_PROVIDER_SITE_OTHER): Payer: BC Managed Care – PPO | Admitting: Family Medicine

## 2018-06-28 DIAGNOSIS — E291 Testicular hypofunction: Secondary | ICD-10-CM

## 2018-06-28 MED ORDER — TESTOSTERONE CYPIONATE 200 MG/ML IM SOLN
200.0000 mg | Freq: Once | INTRAMUSCULAR | Status: AC
  Administered 2018-06-28: 200 mg via INTRAMUSCULAR

## 2018-06-28 NOTE — Progress Notes (Signed)
Testosterone IM Injection  Due to Hypogonadism patient is present today for a Testosterone Injection.  Medication: Testosterone Cypionate Dose: Location: left upper outer buttocks Lot: 23803.010A Exp:08/2019  Patient tolerated well, no complications were noted  Preformed by: Teressa Lower, CMA  Follow up: 2 weeks

## 2018-07-14 ENCOUNTER — Ambulatory Visit (INDEPENDENT_AMBULATORY_CARE_PROVIDER_SITE_OTHER): Payer: BC Managed Care – PPO

## 2018-07-14 ENCOUNTER — Other Ambulatory Visit: Payer: Self-pay

## 2018-07-14 ENCOUNTER — Other Ambulatory Visit: Payer: Self-pay | Admitting: Urology

## 2018-07-14 DIAGNOSIS — E291 Testicular hypofunction: Secondary | ICD-10-CM | POA: Diagnosis not present

## 2018-07-14 MED ORDER — TESTOSTERONE CYPIONATE 200 MG/ML IM SOLN
200.0000 mg | Freq: Once | INTRAMUSCULAR | Status: AC
  Administered 2018-07-14: 200 mg via INTRAMUSCULAR

## 2018-07-14 NOTE — Addendum Note (Signed)
Addended by: Honor Loh on: 07/14/2018 09:52 AM   Modules accepted: Orders

## 2018-07-14 NOTE — Telephone Encounter (Signed)
Pt would like for [place] to order his testosterone.

## 2018-07-14 NOTE — Progress Notes (Signed)
Due to Hypogonadism patient is present today for a Testosterone Injection.  Medication: Testosterone Cypionate Dose: Location: right upper outer buttocks Lot: 23803.010A Exp:08/2019  Patient tolerated well, no complications were noted  Preformed by: Milas Kocher, CMA  Follow up: 2 weeks

## 2018-07-16 MED ORDER — TESTOSTERONE CYPIONATE 200 MG/ML IM SOLN: 200.0000 mg | INTRAMUSCULAR | 0 refills | Status: DC

## 2018-07-27 ENCOUNTER — Other Ambulatory Visit: Payer: Self-pay

## 2018-07-27 ENCOUNTER — Ambulatory Visit (INDEPENDENT_AMBULATORY_CARE_PROVIDER_SITE_OTHER): Payer: BC Managed Care – PPO

## 2018-07-27 DIAGNOSIS — E291 Testicular hypofunction: Secondary | ICD-10-CM

## 2018-07-27 MED ORDER — TESTOSTERONE CYPIONATE 200 MG/ML IM SOLN
200.0000 mg | Freq: Once | INTRAMUSCULAR | Status: AC
  Administered 2018-07-27: 200 mg via INTRAMUSCULAR

## 2018-07-27 NOTE — Progress Notes (Signed)
Testosterone IM Injection  Due to Hypogonadism patient is present today for a Testosterone Injection.  Medication: Testosterone Cypionate Dose: 31ml Location: Left upper outer buttocks Lot: [id number] Exp:11/22  Patient tolerated well  Preformed by: Milas Kocher, CMA   Follow up: 2 weeks

## 2018-07-28 ENCOUNTER — Ambulatory Visit: Payer: BC Managed Care – PPO

## 2018-08-10 ENCOUNTER — Ambulatory Visit: Payer: BC Managed Care – PPO

## 2018-08-24 ENCOUNTER — Other Ambulatory Visit: Payer: Self-pay

## 2018-08-24 ENCOUNTER — Ambulatory Visit (INDEPENDENT_AMBULATORY_CARE_PROVIDER_SITE_OTHER): Payer: BC Managed Care – PPO | Admitting: *Deleted

## 2018-08-24 DIAGNOSIS — E291 Testicular hypofunction: Secondary | ICD-10-CM | POA: Diagnosis not present

## 2018-08-24 MED ORDER — TESTOSTERONE CYPIONATE 200 MG/ML IM SOLN
200.0000 mg | Freq: Once | INTRAMUSCULAR | Status: AC
  Administered 2018-08-24: 200 mg via INTRAMUSCULAR

## 2018-08-24 NOTE — Progress Notes (Signed)
Testosterone IM Injection  Due to Hypogonadism patient is present today for a Testosterone Injection.  Medication: Testosterone Cypionate Dose: 60ml Location: right upper outer buttocks Lot: [id number] Exp:11/22  Patient tolerated well, no complications were noted  Preformed by: Milas Kocher, CMA  Follow up: Two weeks

## 2018-08-28 ENCOUNTER — Other Ambulatory Visit: Payer: Self-pay

## 2018-08-28 MED ORDER — SILDENAFIL CITRATE 20 MG PO TABS: ORAL_TABLET | ORAL | 2 refills | Status: DC

## 2018-09-07 ENCOUNTER — Other Ambulatory Visit: Payer: Self-pay

## 2018-09-07 ENCOUNTER — Ambulatory Visit (INDEPENDENT_AMBULATORY_CARE_PROVIDER_SITE_OTHER): Payer: BC Managed Care – PPO | Admitting: *Deleted

## 2018-09-07 DIAGNOSIS — E291 Testicular hypofunction: Secondary | ICD-10-CM | POA: Diagnosis not present

## 2018-09-07 MED ORDER — TESTOSTERONE CYPIONATE 200 MG/ML IM SOLN
200.0000 mg | Freq: Once | INTRAMUSCULAR | Status: AC
  Administered 2018-09-07: 200 mg via INTRAMUSCULAR

## 2018-09-07 NOTE — Progress Notes (Signed)
Testosterone IM Injection  Due to Hypogonadism patient is present today for a Testosterone Injection.  Medication: Testosterone Cypionate Dose: 106ml Location: left upper outer buttocks Lot: [id number] Exp:08/2019  Patient tolerated well, no complications were noted  Preformed by: Milas Kocher, CMA  Follow up: 2 weeks

## 2018-09-12 ENCOUNTER — Telehealth: Payer: Self-pay | Admitting: Urology

## 2018-09-12 NOTE — Telephone Encounter (Signed)
Prior authorization is required for Sildenafil Citrate 20mg  tablets:  Cover My Meds Key: [id number] Patient Last Name: George Bush DOB: [birth year]

## 2018-09-13 NOTE — Telephone Encounter (Signed)
Please call and confirm with patient he is wanting a PA done. He usually pays for the medication out of pocket and the RX is sent to [facility].

## 2018-09-14 NOTE — Telephone Encounter (Signed)
SPoke with patient and he states that he is paying cash at [facility] and not sure why we received anything from [facility] . No need for prior auth

## 2018-09-19 ENCOUNTER — Other Ambulatory Visit: Payer: Self-pay | Admitting: Urology

## 2018-09-19 MED ORDER — SILDENAFIL CITRATE 20 MG PO TABS: ORAL_TABLET | ORAL | 2 refills | Status: DC

## 2018-09-19 NOTE — Telephone Encounter (Signed)
Pt. Request this medication be sent to [facility] on [address]

## 2018-09-19 NOTE — Telephone Encounter (Signed)
Patient notified, Medication has been sent to [facility], he will pay for medication out of pocket. PA does not need to be completed.

## 2018-09-21 ENCOUNTER — Ambulatory Visit (INDEPENDENT_AMBULATORY_CARE_PROVIDER_SITE_OTHER): Payer: BC Managed Care – PPO | Admitting: *Deleted

## 2018-09-21 ENCOUNTER — Other Ambulatory Visit: Payer: Self-pay

## 2018-09-21 DIAGNOSIS — E291 Testicular hypofunction: Secondary | ICD-10-CM | POA: Diagnosis not present

## 2018-09-21 MED ORDER — TESTOSTERONE CYPIONATE 200 MG/ML IM SOLN
200.0000 mg | Freq: Once | INTRAMUSCULAR | Status: AC
  Administered 2018-09-21: 200 mg via INTRAMUSCULAR

## 2018-09-21 NOTE — Progress Notes (Signed)
Testosterone IM Injection  Due to Hypogonadism patient is present today for a Testosterone Injection.  Medication: Testosterone Cypionate Dose: 1 ml Location: left upper outer buttocks Lot: [id number] Exp:04/2020  Patient tolerated well, no complications were noted  Preformed by: Astrid Vides,CMA Follow up: Two weeks

## 2018-10-05 ENCOUNTER — Other Ambulatory Visit: Payer: Self-pay

## 2018-10-05 ENCOUNTER — Ambulatory Visit (INDEPENDENT_AMBULATORY_CARE_PROVIDER_SITE_OTHER): Payer: BC Managed Care – PPO | Admitting: Family Medicine

## 2018-10-05 DIAGNOSIS — E291 Testicular hypofunction: Secondary | ICD-10-CM

## 2018-10-05 MED ORDER — TESTOSTERONE CYPIONATE 200 MG/ML IM SOLN
200.0000 mg | Freq: Once | INTRAMUSCULAR | Status: AC
  Administered 2018-10-05: 200 mg via INTRAMUSCULAR

## 2018-10-05 NOTE — Progress Notes (Signed)
Testosterone IM Injection  Due to Hypogonadism patient is present today for a Testosterone Injection.  Medication: Testosterone Cypionate Dose: 1ML Location: right upper outer buttocks Lot: [id number] Exp:02/2021  Patient tolerated well, no complications were noted  Preformed by: Elberta Leatherwood, CMA  Follow up: 2 weeks

## 2018-10-19 ENCOUNTER — Ambulatory Visit (INDEPENDENT_AMBULATORY_CARE_PROVIDER_SITE_OTHER): Payer: BC Managed Care – PPO | Admitting: *Deleted

## 2018-10-19 ENCOUNTER — Other Ambulatory Visit: Payer: Self-pay

## 2018-10-19 DIAGNOSIS — E291 Testicular hypofunction: Secondary | ICD-10-CM

## 2018-10-19 MED ORDER — TESTOSTERONE CYPIONATE 200 MG/ML IM SOLN
200.0000 mg | Freq: Once | INTRAMUSCULAR | Status: AC
  Administered 2018-10-19: 200 mg via INTRAMUSCULAR

## 2018-10-19 NOTE — Progress Notes (Signed)
Testosterone IM Injection  Due to Hypogonadism patient is present today for a Testosterone Injection.  Medication: Testosterone Cypionate Dose: 52ml Location: left upper outer buttocks Lot: [id number] Exp:04/2020  Patient tolerated well, no complications were noted  Preformed by: Verlene Mayer, CMA  Follow up: Two weeks

## 2018-11-01 ENCOUNTER — Other Ambulatory Visit: Payer: Self-pay

## 2018-11-01 ENCOUNTER — Other Ambulatory Visit: Payer: BC Managed Care – PPO

## 2018-11-01 ENCOUNTER — Ambulatory Visit (INDEPENDENT_AMBULATORY_CARE_PROVIDER_SITE_OTHER): Payer: BC Managed Care – PPO

## 2018-11-01 DIAGNOSIS — E291 Testicular hypofunction: Secondary | ICD-10-CM

## 2018-11-01 MED ORDER — TESTOSTERONE CYPIONATE 200 MG/ML IM SOLN
200.0000 mg | Freq: Once | INTRAMUSCULAR | Status: AC
  Administered 2018-11-01: 200 mg via INTRAMUSCULAR

## 2018-11-01 NOTE — Addendum Note (Signed)
Addended by: Feliberto Gottron on: 11/01/2018 04:25 PM   Modules accepted: Orders

## 2018-11-01 NOTE — Addendum Note (Signed)
Addended by: Tommy Rainwater on: 11/01/2018 04:28 PM   Modules accepted: Orders

## 2018-11-01 NOTE — Progress Notes (Signed)
Testosterone IM Injection  Due to Hypogonadism patient is present today for a Testosterone Injection.  Medication: Testosterone Cypionate Dose: 200mg /ml Location: right upper outer buttocks Lot: [id number]  Exp:11/22  Patient tolerated well, no complications were noted  Preformed by: Shawnie Dapper, CMA  Follow up: 2 weeks

## 2018-11-02 ENCOUNTER — Telehealth: Payer: Self-pay

## 2018-11-02 ENCOUNTER — Ambulatory Visit: Payer: BC Managed Care – PPO

## 2018-11-02 LAB — PSA: Prostate Specific Ag, Serum: 0.5 ng/mL (ref 0.0–4.0)

## 2018-11-02 LAB — HEMATOCRIT: Hematocrit: 47.6 % (ref 37.5–51.0)

## 2018-11-02 LAB — TESTOSTERONE: Testosterone: 191 ng/dL — ABNORMAL LOW (ref 264–916)

## 2018-11-02 NOTE — Telephone Encounter (Signed)
Left patient mess to call 

## 2018-11-02 NOTE — Telephone Encounter (Signed)
-----   Message from Abbie Sons, MD sent at 11/02/2018 11:12 AM EDT ----- PSA and hematocrit look good.  Trough testosterone level was 191.  If not having significant symptoms can continue present dose.

## 2018-11-02 NOTE — Telephone Encounter (Signed)
Pt called back and I read message. °

## 2018-11-15 ENCOUNTER — Other Ambulatory Visit: Payer: Self-pay | Admitting: Urology

## 2018-11-15 NOTE — Telephone Encounter (Signed)
Pt asks for a refill on his Sildenafil. Please advise.

## 2018-11-16 ENCOUNTER — Other Ambulatory Visit: Payer: Self-pay

## 2018-11-16 ENCOUNTER — Ambulatory Visit (INDEPENDENT_AMBULATORY_CARE_PROVIDER_SITE_OTHER): Payer: BC Managed Care – PPO

## 2018-11-16 DIAGNOSIS — E291 Testicular hypofunction: Secondary | ICD-10-CM

## 2018-11-16 MED ORDER — TESTOSTERONE CYPIONATE 200 MG/ML IM SOLN
200.0000 mg | Freq: Once | INTRAMUSCULAR | Status: AC
  Administered 2018-11-16: 200 mg via INTRAMUSCULAR

## 2018-11-16 MED ORDER — SILDENAFIL CITRATE 20 MG PO TABS: ORAL_TABLET | ORAL | 5 refills | Status: DC

## 2018-11-16 NOTE — Progress Notes (Signed)
Testosterone IM Injection  Due to Hypogonadism patient is present today for a Testosterone Injection.  Medication: Testosterone Cypionate Dose: 74mL Location: left upper outer buttocks Lot: [id number] Exp:02/2021  Patient tolerated well, no complications were noted  Preformed by: Gordy Clement, CMA (AAMA)  Follow up: RTC in 1 week

## 2018-11-16 NOTE — Telephone Encounter (Signed)
Refill sent.

## 2018-11-20 ENCOUNTER — Other Ambulatory Visit: Payer: Self-pay | Admitting: Family Medicine

## 2018-11-20 MED ORDER — SILDENAFIL CITRATE 20 MG PO TABS: ORAL_TABLET | ORAL | 2 refills | Status: DC

## 2018-11-30 ENCOUNTER — Ambulatory Visit (INDEPENDENT_AMBULATORY_CARE_PROVIDER_SITE_OTHER): Payer: BC Managed Care – PPO

## 2018-11-30 ENCOUNTER — Other Ambulatory Visit: Payer: Self-pay

## 2018-11-30 DIAGNOSIS — E291 Testicular hypofunction: Secondary | ICD-10-CM

## 2018-11-30 MED ORDER — TESTOSTERONE CYPIONATE 200 MG/ML IM SOLN
200.0000 mg | Freq: Once | INTRAMUSCULAR | Status: AC
  Administered 2018-11-30: 200 mg via INTRAMUSCULAR

## 2018-11-30 NOTE — Progress Notes (Signed)
Testosterone IM Injection  Due to Hypogonadism patient is present today for a Testosterone Injection.  Medication: Testosterone Cypionate Dose: 28ml Location: right upper outer buttocks Lot: [id number]  Exp:11/31/22  Patient tolerated well, no complications were noted  Preformed by: Shawnie Dapper, CMA  Follow up: 2 weeks

## 2018-12-14 ENCOUNTER — Other Ambulatory Visit: Payer: Self-pay

## 2018-12-14 ENCOUNTER — Ambulatory Visit (INDEPENDENT_AMBULATORY_CARE_PROVIDER_SITE_OTHER): Payer: BC Managed Care – PPO

## 2018-12-14 DIAGNOSIS — E291 Testicular hypofunction: Secondary | ICD-10-CM | POA: Diagnosis not present

## 2018-12-14 MED ORDER — TESTOSTERONE CYPIONATE 200 MG/ML IM SOLN
200.0000 mg | Freq: Once | INTRAMUSCULAR | Status: AC
  Administered 2018-12-14: 200 mg via INTRAMUSCULAR

## 2018-12-14 NOTE — Progress Notes (Signed)
Testosterone IM Injection  Due to Hypogonadism patient is present today for a Testosterone Injection.  Medication: Testosterone Cypionate Dose: 66mL Location: left upper outer buttocks Lot: [id number] Exp:02/2021  Patient tolerated well, no complications were noted  Preformed by: [place], CMA  Follow up: RTC in 2 weeks

## 2018-12-17 MED ORDER — TESTOSTERONE CYPIONATE 200 MG/ML IM SOLN: 200.0000 mg | INTRAMUSCULAR | 0 refills | Status: DC

## 2018-12-28 ENCOUNTER — Ambulatory Visit: Payer: BC Managed Care – PPO

## 2018-12-29 ENCOUNTER — Other Ambulatory Visit: Payer: Self-pay

## 2018-12-29 ENCOUNTER — Ambulatory Visit (INDEPENDENT_AMBULATORY_CARE_PROVIDER_SITE_OTHER): Payer: BC Managed Care – PPO | Admitting: Family Medicine

## 2018-12-29 DIAGNOSIS — E291 Testicular hypofunction: Secondary | ICD-10-CM

## 2018-12-29 MED ORDER — TESTOSTERONE CYPIONATE 200 MG/ML IM SOLN
200.0000 mg | Freq: Once | INTRAMUSCULAR | Status: AC
  Administered 2018-12-29: 200 mg via INTRAMUSCULAR

## 2018-12-29 NOTE — Progress Notes (Signed)
Testosterone IM Injection  Due to Hypogonadism patient is present today for a Testosterone Injection.  Medication: Testosterone Cypionate Dose: 55ml Location: right upper outer buttocks Lot: [id number] Exp:02/2020  Patient tolerated well, no complications were noted  Preformed by: Elberta Leatherwood, CMA  Follow up: 2 weeks

## 2019-01-04 ENCOUNTER — Ambulatory Visit: Payer: BC Managed Care – PPO

## 2019-01-12 ENCOUNTER — Other Ambulatory Visit: Payer: Self-pay

## 2019-01-12 ENCOUNTER — Ambulatory Visit (INDEPENDENT_AMBULATORY_CARE_PROVIDER_SITE_OTHER): Payer: BC Managed Care – PPO | Admitting: Family Medicine

## 2019-01-12 DIAGNOSIS — E291 Testicular hypofunction: Secondary | ICD-10-CM

## 2019-01-12 MED ORDER — TESTOSTERONE CYPIONATE 200 MG/ML IM SOLN
200.0000 mg | Freq: Once | INTRAMUSCULAR | Status: AC
  Administered 2019-01-12: 200 mg via INTRAMUSCULAR

## 2019-01-12 NOTE — Progress Notes (Signed)
Testosterone IM Injection  Due to Hypogonadism patient is present today for a Testosterone Injection.  Medication: Testosterone Cypionate Dose: 1ML Location: left upper outer buttocks Lot: [id number] Exp:02/2020  Patient tolerated well, no complications were noted  Preformed by: Elberta Leatherwood, CMA  Follow up: 2 weeks

## 2019-01-25 ENCOUNTER — Encounter: Payer: Self-pay | Admitting: Urology

## 2019-01-25 ENCOUNTER — Ambulatory Visit: Payer: BC Managed Care – PPO

## 2019-02-01 ENCOUNTER — Ambulatory Visit (INDEPENDENT_AMBULATORY_CARE_PROVIDER_SITE_OTHER): Payer: BC Managed Care – PPO | Admitting: *Deleted

## 2019-02-01 ENCOUNTER — Other Ambulatory Visit: Payer: Self-pay

## 2019-02-01 DIAGNOSIS — E291 Testicular hypofunction: Secondary | ICD-10-CM | POA: Diagnosis not present

## 2019-02-01 MED ORDER — TESTOSTERONE CYPIONATE 200 MG/ML IM SOLN
200.0000 mg | Freq: Once | INTRAMUSCULAR | Status: AC
  Administered 2019-02-01: 200 mg via INTRAMUSCULAR

## 2019-02-01 NOTE — Progress Notes (Signed)
Testosterone IM Injection  Due to Hypogonadism patient is present today for a Testosterone Injection.  Medication: Testosterone Cypionate Dose: 46ml Location: right upper outer buttocks Lot: [id number] Exp:03/25/2020  Patient tolerated well, no complications were noted  Performed by: Verlene Mayer, CMA  Follow up: two weeks

## 2019-02-08 ENCOUNTER — Telehealth: Payer: Self-pay | Admitting: Urology

## 2019-02-08 DIAGNOSIS — N5201 Erectile dysfunction due to arterial insufficiency: Secondary | ICD-10-CM

## 2019-02-08 MED ORDER — SILDENAFIL CITRATE 20 MG PO TABS: ORAL_TABLET | ORAL | 2 refills | Status: DC

## 2019-02-08 NOTE — Telephone Encounter (Signed)
Pt LMOM and needs a refill for Sildenafil.

## 2019-02-08 NOTE — Telephone Encounter (Signed)
RX sent

## 2019-02-14 ENCOUNTER — Other Ambulatory Visit: Payer: Self-pay | Admitting: *Deleted

## 2019-02-14 DIAGNOSIS — Z20822 Contact with and (suspected) exposure to covid-19: Secondary | ICD-10-CM

## 2019-02-15 ENCOUNTER — Ambulatory Visit: Payer: BC Managed Care – PPO

## 2019-02-15 LAB — NOVEL CORONAVIRUS, NAA: SARS-CoV-2, NAA: NOT DETECTED

## 2019-02-22 ENCOUNTER — Telehealth: Payer: Self-pay | Admitting: Internal Medicine

## 2019-02-22 NOTE — Telephone Encounter (Signed)
Patient called in had received test result last Friday 02/16/2019

## 2019-02-23 ENCOUNTER — Ambulatory Visit (INDEPENDENT_AMBULATORY_CARE_PROVIDER_SITE_OTHER): Payer: BC Managed Care – PPO | Admitting: *Deleted

## 2019-02-23 ENCOUNTER — Other Ambulatory Visit: Payer: Self-pay

## 2019-02-23 DIAGNOSIS — E291 Testicular hypofunction: Secondary | ICD-10-CM | POA: Diagnosis not present

## 2019-02-23 MED ORDER — TESTOSTERONE CYPIONATE 200 MG/ML IM SOLN
200.0000 mg | Freq: Once | INTRAMUSCULAR | Status: AC
  Administered 2019-02-23: 200 mg via INTRAMUSCULAR

## 2019-02-23 NOTE — Progress Notes (Signed)
Testosterone IM Injection  Due to Hypogonadism patient is present today for a Testosterone Injection.  Medication: Testosterone Cypionate Dose: 28ml Location: right upper outer buttocks Lot: [id number] Exp:03/25/2020  Patient tolerated well, no complications were noted  Performed by: Gaspar Cola CMA   Follow up: two weeks

## 2019-02-24 ENCOUNTER — Emergency Department: Payer: BC Managed Care – PPO

## 2019-02-24 ENCOUNTER — Encounter: Payer: Self-pay | Admitting: Emergency Medicine

## 2019-02-24 ENCOUNTER — Emergency Department
Admission: EM | Admit: 2019-02-24 | Discharge: 2019-02-24 | Disposition: A | Discharge status: Home / Self Care | Payer: BC Managed Care – PPO | Attending: Emergency Medicine | Admitting: Emergency Medicine

## 2019-02-24 ENCOUNTER — Other Ambulatory Visit: Payer: Self-pay

## 2019-02-24 DIAGNOSIS — T148XXA Other injury of unspecified body region, initial encounter: Secondary | ICD-10-CM

## 2019-02-24 DIAGNOSIS — Y93H2 Activity, gardening and landscaping: Secondary | ICD-10-CM | POA: Insufficient documentation

## 2019-02-24 DIAGNOSIS — I10 Essential (primary) hypertension: Secondary | ICD-10-CM | POA: Diagnosis not present

## 2019-02-24 DIAGNOSIS — Z7984 Long term (current) use of oral hypoglycemic drugs: Secondary | ICD-10-CM | POA: Diagnosis not present

## 2019-02-24 DIAGNOSIS — S46211A Strain of muscle, fascia and tendon of other parts of biceps, right arm, initial encounter: Secondary | ICD-10-CM | POA: Insufficient documentation

## 2019-02-24 DIAGNOSIS — E119 Type 2 diabetes mellitus without complications: Secondary | ICD-10-CM | POA: Diagnosis not present

## 2019-02-24 DIAGNOSIS — Y999 Unspecified external cause status: Secondary | ICD-10-CM | POA: Diagnosis not present

## 2019-02-24 DIAGNOSIS — Z7982 Long term (current) use of aspirin: Secondary | ICD-10-CM | POA: Diagnosis not present

## 2019-02-24 DIAGNOSIS — X500XXA Overexertion from strenuous movement or load, initial encounter: Secondary | ICD-10-CM | POA: Insufficient documentation

## 2019-02-24 DIAGNOSIS — M79621 Pain in right upper arm: Secondary | ICD-10-CM | POA: Insufficient documentation

## 2019-02-24 DIAGNOSIS — Y92007 Garden or yard of unspecified non-institutional (private) residence as the place of occurrence of the external cause: Secondary | ICD-10-CM | POA: Diagnosis not present

## 2019-02-24 DIAGNOSIS — B20 Human immunodeficiency virus [HIV] disease: Secondary | ICD-10-CM | POA: Diagnosis not present

## 2019-02-24 DIAGNOSIS — S4992XA Unspecified injury of left shoulder and upper arm, initial encounter: Secondary | ICD-10-CM | POA: Diagnosis present

## 2019-02-24 NOTE — ED Notes (Signed)
See triage note  Presents with right arm pain   Provider in with pt

## 2019-02-24 NOTE — Discharge Instructions (Addendum)
Wear arm sling until evaluation by orthopedics.  Call the number listed above at [phone] on Monday morning.  Tell them you are follow-up in the emergency room.  Return back to ED if condition worsens.

## 2019-02-24 NOTE — ED Triage Notes (Signed)
Pt was working in yard and picking up tree and fence that came down and felt a pull/pop in right upper arm.  Indentation noted to bicep area.  No pain if not moving arm, possible torn muscle.

## 2019-02-24 NOTE — ED Provider Notes (Addendum)
[facility] Emergency Department Provider Note   ____________________________________________   First MD Initiated Contact with Patient 02/24/19 1420     (approximate)  I have reviewed the triage vital signs and the nursing notes.   HISTORY  Chief Complaint Arm Pain    HPI George Bush is a 53 y.o. male patient suspect partial bicep tendon rupture of the right upper extremity.  Patient state he was working the yard picking up a tree that fell on his fence.  Patient said he felt a "pop in the right upper arm.  Patient immediately after that he noticed indentation in the bicep area.  Patient denies pain with movement.  Patient denies loss of sensation.      History reviewed. No pertinent past medical history.  Patient Active Problem List   Diagnosis Date Noted  . Erectile dysfunction due to arterial insufficiency 10/10/2017  . Hypogonadism in male 10/10/2017  . Hypertensive retinopathy of both eyes, grade 2 10/04/2016  . B12 deficiency 10/29/2015  . Healthcare maintenance 11/27/2012  . AIN grade II 11/21/2012  . Dyslipidemia 11/21/2012  . Elevated lipase 11/21/2012  . HIV (human immunodeficiency virus infection) ([place]) 11/21/2012  . HTN (hypertension) 11/21/2012  . Obesity 11/21/2012  . Type 2 diabetes mellitus with diabetic nephropathy ([place]) 11/21/2012  . Condyloma acuminata 04/28/2011    History reviewed. No pertinent surgical history.  Prior to Admission medications   Medication Sig Start Date End Date Taking? Authorizing Provider  aspirin EC 81 MG tablet Take 81 mg by mouth. 09/09/08   [provider]  atorvastatin (LIPITOR) 40 MG tablet TAKE ONE TABLET EVERY DAY 11/18/16   [provider]  elvitegravir-cobicistat-emtricitabine-tenofovir (GENVOYA) 150-150-200-10 MG TABS tablet Take by mouth. 07/05/16   [provider]  empagliflozin (JARDIANCE) 25 MG TABS tablet Take 25 mg by mouth. 01/04/17   [provider]  Insulin Degludec-Liraglutide 100-3.6 UNIT-MG/ML SOPN Inject into the skin. 02/28/17   [provider]  lisinopril-hydrochlorothiazide (PRINZIDE,ZESTORETIC) 20-25 MG tablet Take by mouth. 10/05/16   [provider]  metFORMIN (GLUCOPHAGE) 1000 MG tablet TAKE 1 TABLET BY MOUTH TWICE DAILY WITH A MEAL 12/16/16   [provider]  naproxen (NAPROSYN) 500 MG tablet  09/18/16   [provider]  sildenafil (REVATIO) 20 MG tablet Take 2-5 tablets as needed for erectile dysfunction. 02/08/19   Stoioff, Ronda Fairly, MD  tadalafil (ADCIRCA/CIALIS) 20 MG tablet Take 0.5 tablets (10 mg total) by mouth daily as needed for erectile dysfunction. 03/03/18   Stoioff, Ronda Fairly, MD  testosterone cypionate (DEPOTESTOSTERONE CYPIONATE) 200 MG/ML injection Inject 1 mL (200 mg total) into the muscle every 14 (fourteen) days. 12/17/18   Stoioff, Ronda Fairly, MD    Allergies Patient has no known allergies.  History reviewed. No pertinent family history.  Social History Social History   Tobacco Use  . Smoking status: Never Smoker  . Smokeless tobacco: Never Used  Substance Use Topics  . Alcohol use: No    Frequency: Never  . Drug use: No    Review of Systems Constitutional: No fever/chills Eyes: No visual changes. ENT: No sore throat. Cardiovascular: Denies chest pain. Respiratory: Denies shortness of breath. Gastrointestinal: No abdominal pain.  No nausea, no vomiting.  No diarrhea.  No constipation. Genitourinary: Negative for dysuria. Musculoskeletal: Negative for back pain. Skin: Negative for rash. Neurological: Negative for headaches, focal weakness or numbness. Endocrine:  Diabetes, hyperlipidemia, and hypertension.  ____________________________________________   PHYSICAL EXAM:  VITAL SIGNS: ED Triage  Vitals [02/24/19 1405]  Enc Vitals Group     BP 118/81     Pulse Rate 100     Resp 20     Temp 98.8 F (37.1 C)     Temp Source Oral     SpO2 93 %      Weight 266 lb (120.7 kg)     Height 5\' 10"  (1.778 m)     Head Circumference      Peak Flow      Pain Score 0     Pain Loc      Pain Edu?      Excl. in GC?    Constitutional: Alert and oriented. Well appearing and in no acute distress. Cardiovascular: Normal rate, regular rhythm. Grossly normal heart sounds.  Good peripheral circulation. Respiratory: Normal respiratory effort.  No retractions. Lungs CTAB. Musculoskeletal: No lower extremity tenderness nor edema.  No joint effusions. Neurologic:  Normal speech and language. No gross focal neurologic deficits are appreciated. No gait instability. Skin:  Skin is warm, dry and intact. No rash noted. Psychiatric: Mood and affect are normal. Speech and behavior are normal.  ____________________________________________   LABS (all labs ordered are listed, but only abnormal results are displayed)  Labs Reviewed - No data to display ____________________________________________  EKG   ____________________________________________  RADIOLOGY  ED MD interpretation:    Official radiology report(s): [place] Rt Upper Extrem Ltd Soft Tissue Non Vascular  Result Date: 02/24/2019 CLINICAL DATA:  Working in yard. Lifted a tree and felt a pop in right upper arm. Question biceps muscle tear. EXAM: ULTRASOUND right upper extremity UPPER EXTREMITY LIMITED TECHNIQUE: Ultrasound examination of the upper extremity soft tissues was performed in the area of clinical concern. COMPARISON:  None. FINDINGS: In the region of the distal biceps muscle and biceps tendon no obvious muscle tear/intramuscular hematoma, edema or focal fluid. The vascular structures appear normal. IMPRESSION: No obvious biceps muscle or tendon tear. If symptoms persist MRI is suggested for further evaluation. Electronically Signed   By: Rudie MeyerP.  Gallerani M.D.   On: 02/24/2019 15:12    ____________________________________________   PROCEDURES  Procedure(s) performed (including Critical  Care):  Procedures   ____________________________________________   INITIAL IMPRESSION / ASSESSMENT AND PLAN / ED COURSE  As part of my medical decision making, I reviewed the following data within the electronic MEDICAL RECORD NUMBER         Gregery NaFredrick D Greff was evaluated in Emergency Department on 02/24/2019 for the symptoms described in the history of present illness. He was evaluated in the context of the global COVID-19 pandemic, which necessitated consideration that the patient might be at risk for infection with the SARS-CoV-2 virus that causes COVID-19. Institutional protocols and algorithms that pertain to the evaluation of patients at risk for COVID-19 are in a state of rapid change based on information released by regulatory bodies including the CDC and federal and state organizations. These policies and algorithms were followed during the patient's care in the ED.  Patient presents with tenderness to the distal bicep tendon after a "popping" sensation while lifting a tree from his fence.  Discussed negative ultrasound findings with patient.  Patient still demonstrates full and equal range of motion with flexion extension.  Patient continues have moderate guarding at the insertion point of the bicep tendon.  Patient placed in a sling and will be further evaluated by orthopedics.     ____________________________________________   FINAL CLINICAL IMPRESSION(S) / ED DIAGNOSES  Final diagnoses:  Biceps muscle  strain, right, initial encounter     ED Discharge Orders    None       Note:  This document was prepared using Dragon voice recognition software and may include unintentional dictation errors.    Joni Reining, PA-C 02/24/19 1533    Joni Reining, PA-C 02/24/19 1537    Shaune Pollack, MD 02/25/19 2031

## 2019-03-05 ENCOUNTER — Other Ambulatory Visit: Payer: Self-pay | Admitting: Sports Medicine

## 2019-03-05 DIAGNOSIS — S59901A Unspecified injury of right elbow, initial encounter: Secondary | ICD-10-CM

## 2019-03-05 DIAGNOSIS — S46219A Strain of muscle, fascia and tendon of other parts of biceps, unspecified arm, initial encounter: Secondary | ICD-10-CM

## 2019-03-09 ENCOUNTER — Other Ambulatory Visit: Payer: Self-pay

## 2019-03-09 ENCOUNTER — Ambulatory Visit (INDEPENDENT_AMBULATORY_CARE_PROVIDER_SITE_OTHER): Payer: BC Managed Care – PPO | Admitting: Family Medicine

## 2019-03-09 ENCOUNTER — Encounter: Payer: Self-pay | Admitting: Family Medicine

## 2019-03-09 DIAGNOSIS — E291 Testicular hypofunction: Secondary | ICD-10-CM

## 2019-03-09 MED ORDER — TESTOSTERONE CYPIONATE 200 MG/ML IM SOLN
200.0000 mg | Freq: Once | INTRAMUSCULAR | Status: AC
  Administered 2019-03-09: 200 mg via INTRAMUSCULAR

## 2019-03-09 NOTE — Progress Notes (Signed)
Testosterone IM Injection  Due to Hypogonadism patient is present today for a Testosterone Injection.  Medication: Testosterone Cypionate Dose: 34ml Location: left upper outer buttocks Lot: 23804.010A Exp:02/2020  Patient tolerated well, no complications were noted  Preformed by: Elberta Leatherwood, CMA  Follow up: 2 weeks

## 2019-03-15 ENCOUNTER — Other Ambulatory Visit: Payer: Self-pay

## 2019-03-15 ENCOUNTER — Ambulatory Visit
Admission: RE | Admit: 2019-03-15 | Discharge: 2019-03-15 | Disposition: A | Discharge status: Home / Self Care | Payer: BC Managed Care – PPO | Source: Ambulatory Visit | Attending: Sports Medicine | Admitting: Sports Medicine

## 2019-03-15 DIAGNOSIS — S59901A Unspecified injury of right elbow, initial encounter: Secondary | ICD-10-CM

## 2019-03-15 DIAGNOSIS — S46219A Strain of muscle, fascia and tendon of other parts of biceps, unspecified arm, initial encounter: Secondary | ICD-10-CM | POA: Insufficient documentation

## 2019-03-16 ENCOUNTER — Other Ambulatory Visit: Payer: Self-pay | Admitting: Orthopedic Surgery

## 2019-03-16 ENCOUNTER — Other Ambulatory Visit
Admission: RE | Admit: 2019-03-16 | Discharge: 2019-03-16 | Disposition: A | Discharge status: Home / Self Care | Payer: BC Managed Care – PPO | Source: Ambulatory Visit | Attending: Orthopedic Surgery | Admitting: Orthopedic Surgery

## 2019-03-16 DIAGNOSIS — Z20828 Contact with and (suspected) exposure to other viral communicable diseases: Secondary | ICD-10-CM | POA: Diagnosis not present

## 2019-03-16 DIAGNOSIS — Z01812 Encounter for preprocedural laboratory examination: Secondary | ICD-10-CM | POA: Diagnosis present

## 2019-03-16 LAB — SARS CORONAVIRUS 2 (TAT 6-24 HRS): SARS Coronavirus 2: NEGATIVE

## 2019-03-19 ENCOUNTER — Other Ambulatory Visit: Payer: Self-pay

## 2019-03-19 ENCOUNTER — Encounter
Admission: RE | Admit: 2019-03-19 | Discharge: 2019-03-19 | Disposition: A | Discharge status: Home / Self Care | Payer: BC Managed Care – PPO | Source: Ambulatory Visit | Attending: Orthopedic Surgery | Admitting: Orthopedic Surgery

## 2019-03-19 HISTORY — DX: Asymptomatic human immunodeficiency virus (hiv) infection status: Z21

## 2019-03-19 HISTORY — DX: Human immunodeficiency virus (HIV) disease: B20

## 2019-03-19 NOTE — Patient Instructions (Signed)
Your procedure is scheduled on: Tuesday 03/20/19.  Report to DAY SURGERY DEPARTMENT LOCATED ON 2ND FLOOR MEDICAL MALL ENTRANCE. To find out your arrival time please call [phone] between 1PM - 3PM on Today.   Remember: Instructions that are not followed completely may result in serious medical risk, up to and including death, or upon the discretion of your surgeon and anesthesiologist your surgery may need to be rescheduled.      _X__ 1. Do not eat food after midnight the night before your procedure.                 No gum chewing or hard candies. You may drink SUGAR FREE clear liquids up to 2 hours                 before you are scheduled to arrive for your surgery- DO NOT drink clear                 liquids within 2 hours of the start of your surgery.                    __X__2.  On the morning of surgery brush your teeth with toothpaste and water, you may rinse your mouth with mouthwash if you wish.  Do not swallow any toothpaste or mouthwash.       __X__3.  Notify your doctor if there is any change in your medical condition      (cold, fever, infections).       Do not wear jewelry, make-up, hairpins, clips or nail polish. Do not wear lotions, powders, or perfumes.  Do not shave 48 hours prior to surgery. Men may shave face and neck. Do not bring valuables to the hospital.      [facility] is not responsible for any belongings or valuables.    Contacts, dentures/partials or body piercings may not be worn into surgery. Bring a case for your contacts, glasses or hearing aids, a denture cup will be supplied.     Patients discharged the day of surgery will not be allowed to drive home.     __X__ Take these medicines the morning of surgery with A SIP OF WATER:     1. atorvastatin (LIPITOR) 40 MG tablet  2. elvitegravir-cobicistat-emtricitabine-tenofovir (GENVOYA) 150-150-200-10 MG TABS tablet    __X__ Use an antibacterial soap to shower the night before your  surgery and the morning of your surgery.    __X__ Stop Metformin 2 days prior to surgery.      __X__ Take 1/2 of usual insulin dose the night before surgery. No insulin the morning          of surgery.     __X__ Stop Anti-inflammatories 7 days before surgery such as Advil, Ibuprofen, Motrin, BC or Goodies Powder, Naprosyn, Naproxen, Aleve, Aspirin, Meloxicam. May take Tylenol if needed for pain or discomfort.     __X__ Do not start taking any new herbal supplements before your procedure.

## 2019-03-20 ENCOUNTER — Encounter: Payer: Self-pay | Admitting: Emergency Medicine

## 2019-03-20 ENCOUNTER — Ambulatory Visit: Payer: BC Managed Care – PPO | Admitting: Certified Registered Nurse Anesthetist

## 2019-03-20 ENCOUNTER — Ambulatory Visit
Admission: RE | Admit: 2019-03-20 | Discharge: 2019-03-20 | Disposition: A | Discharge status: Home / Self Care | Payer: BC Managed Care – PPO | Attending: Orthopedic Surgery | Admitting: Orthopedic Surgery

## 2019-03-20 ENCOUNTER — Other Ambulatory Visit: Payer: Self-pay

## 2019-03-20 ENCOUNTER — Encounter
Admission: RE | Disposition: A | Discharge status: Home / Self Care | Payer: Self-pay | Source: Home / Self Care | Attending: Orthopedic Surgery

## 2019-03-20 ENCOUNTER — Ambulatory Visit: Payer: BC Managed Care – PPO

## 2019-03-20 DIAGNOSIS — Z794 Long term (current) use of insulin: Secondary | ICD-10-CM | POA: Insufficient documentation

## 2019-03-20 DIAGNOSIS — E669 Obesity, unspecified: Secondary | ICD-10-CM | POA: Insufficient documentation

## 2019-03-20 DIAGNOSIS — Z833 Family history of diabetes mellitus: Secondary | ICD-10-CM | POA: Diagnosis not present

## 2019-03-20 DIAGNOSIS — X500XXA Overexertion from strenuous movement or load, initial encounter: Secondary | ICD-10-CM | POA: Diagnosis not present

## 2019-03-20 DIAGNOSIS — Z79899 Other long term (current) drug therapy: Secondary | ICD-10-CM | POA: Insufficient documentation

## 2019-03-20 DIAGNOSIS — Z21 Asymptomatic human immunodeficiency virus [HIV] infection status: Secondary | ICD-10-CM | POA: Insufficient documentation

## 2019-03-20 DIAGNOSIS — I10 Essential (primary) hypertension: Secondary | ICD-10-CM | POA: Diagnosis not present

## 2019-03-20 DIAGNOSIS — E119 Type 2 diabetes mellitus without complications: Secondary | ICD-10-CM | POA: Diagnosis not present

## 2019-03-20 DIAGNOSIS — Z6838 Body mass index (BMI) 38.0-38.9, adult: Secondary | ICD-10-CM | POA: Diagnosis not present

## 2019-03-20 DIAGNOSIS — S46211A Strain of muscle, fascia and tendon of other parts of biceps, right arm, initial encounter: Secondary | ICD-10-CM | POA: Diagnosis present

## 2019-03-20 DIAGNOSIS — Y9389 Activity, other specified: Secondary | ICD-10-CM | POA: Diagnosis not present

## 2019-03-20 HISTORY — PX: DISTAL BICEPS TENDON REPAIR: SHX1461

## 2019-03-20 LAB — BASIC METABOLIC PANEL
Anion gap: 10 (ref 5–15)
BUN: 14 mg/dL (ref 6–20)
CO2: 24 mmol/L (ref 22–32)
Calcium: 9 mg/dL (ref 8.9–10.3)
Chloride: 105 mmol/L (ref 98–111)
Creatinine, Ser: 1.04 mg/dL (ref 0.61–1.24)
GFR calc Af Amer: >60 mL/min (ref >60)
GFR calc non Af Amer: >60 mL/min (ref >60)
Glucose, Bld: 123 mg/dL — ABNORMAL HIGH (ref 70–99)
Potassium: 3.9 mmol/L (ref 3.5–5.1)
Sodium: 139 mmol/L (ref 135–145)

## 2019-03-20 LAB — CBC
HCT: 43.7 % (ref 39.0–52.0)
Hemoglobin: 15 g/dL (ref 13.0–17.0)
MCH: 29 pg (ref 26.0–34.0)
MCHC: 34.3 g/dL (ref 30.0–36.0)
MCV: 84.4 fL (ref 80.0–100.0)
Platelets: 154 10*3/uL (ref 150–400)
RBC: 5.18 MIL/uL (ref 4.22–5.81)
RDW: 14.6 % (ref 11.5–15.5)
WBC: 4.2 10*3/uL (ref 4.0–10.5)
nRBC: 0 % (ref 0.0–0.2)

## 2019-03-20 LAB — GLUCOSE, CAPILLARY: Glucose-Capillary: 159 mg/dL — ABNORMAL HIGH (ref 70–99)

## 2019-03-20 SURGERY — REPAIR, TENDON, BICEPS, DISTAL
Anesthesia: General | Laterality: Right

## 2019-03-20 MED ORDER — FAMOTIDINE 20 MG PO TABS
ORAL_TABLET | ORAL | Status: AC
  Filled 2019-03-20: qty 1

## 2019-03-20 MED ORDER — DEXAMETHASONE SODIUM PHOSPHATE 10 MG/ML IJ SOLN
INTRAMUSCULAR | Status: DC | PRN
  Administered 2019-03-20: 10 mg via INTRAVENOUS

## 2019-03-20 MED ORDER — MIDAZOLAM HCL 2 MG/2ML IJ SOLN
1.0000 mg | Freq: Once | INTRAMUSCULAR | Status: AC
  Administered 2019-03-20: 14:00:00 1 mg via INTRAVENOUS

## 2019-03-20 MED ORDER — PHENYLEPHRINE HCL (PRESSORS) 10 MG/ML IV SOLN
INTRAVENOUS | Status: DC | PRN
  Administered 2019-03-20 (×2): 100 ug via INTRAVENOUS
  Administered 2019-03-20: 50 ug via INTRAVENOUS

## 2019-03-20 MED ORDER — LIDOCAINE HCL (PF) 1 % IJ SOLN
INTRAMUSCULAR | Status: AC
  Filled 2019-03-20: qty 5

## 2019-03-20 MED ORDER — MIDAZOLAM HCL 2 MG/2ML IJ SOLN
INTRAMUSCULAR | Status: AC
  Administered 2019-03-20: 1 mg via INTRAVENOUS
  Filled 2019-03-20: qty 2

## 2019-03-20 MED ORDER — FENTANYL CITRATE (PF) 100 MCG/2ML IJ SOLN
INTRAMUSCULAR | Status: AC
  Administered 2019-03-20: 50 ug via INTRAVENOUS
  Filled 2019-03-20: qty 2

## 2019-03-20 MED ORDER — ROCURONIUM BROMIDE 50 MG/5ML IV SOLN
INTRAVENOUS | Status: AC
  Filled 2019-03-20: qty 1

## 2019-03-20 MED ORDER — MIDAZOLAM HCL 2 MG/2ML IJ SOLN
INTRAMUSCULAR | Status: DC | PRN
  Administered 2019-03-20: 2 mg via INTRAVENOUS

## 2019-03-20 MED ORDER — SUCCINYLCHOLINE CHLORIDE 20 MG/ML IJ SOLN
INTRAMUSCULAR | Status: DC | PRN
  Administered 2019-03-20: 120 mg via INTRAVENOUS

## 2019-03-20 MED ORDER — ROPIVACAINE HCL 5 MG/ML IJ SOLN
INTRAMUSCULAR | Status: AC
  Filled 2019-03-20: qty 30

## 2019-03-20 MED ORDER — MIDAZOLAM HCL 2 MG/2ML IJ SOLN
INTRAMUSCULAR | Status: AC
  Filled 2019-03-20: qty 2

## 2019-03-20 MED ORDER — PROPOFOL 10 MG/ML IV BOLUS
INTRAVENOUS | Status: DC | PRN
  Administered 2019-03-20: 200 mg via INTRAVENOUS

## 2019-03-20 MED ORDER — ROPIVACAINE HCL 5 MG/ML IJ SOLN
INTRAMUSCULAR | Status: DC | PRN
  Administered 2019-03-20: 30 mL via PERINEURAL

## 2019-03-20 MED ORDER — OXYCODONE HCL 5 MG PO TABS: 5.0000 mg | ORAL_TABLET | ORAL | 0 refills | Status: DC | PRN

## 2019-03-20 MED ORDER — SUGAMMADEX SODIUM 500 MG/5ML IV SOLN
INTRAVENOUS | Status: DC | PRN
  Administered 2019-03-20: 242.2 mg via INTRAVENOUS

## 2019-03-20 MED ORDER — SUGAMMADEX SODIUM 500 MG/5ML IV SOLN
INTRAVENOUS | Status: AC
  Filled 2019-03-20: qty 5

## 2019-03-20 MED ORDER — DEXMEDETOMIDINE HCL 200 MCG/2ML IV SOLN
INTRAVENOUS | Status: DC | PRN
  Administered 2019-03-20: 4 ug via INTRAVENOUS
  Administered 2019-03-20: 8 ug via INTRAVENOUS

## 2019-03-20 MED ORDER — ONDANSETRON HCL 4 MG/2ML IJ SOLN: 4.0000 mg | Freq: Once | INTRAMUSCULAR | Status: DC | PRN

## 2019-03-20 MED ORDER — SODIUM CHLORIDE 0.9 % IV SOLN
INTRAVENOUS | Status: DC | PRN
  Administered 2019-03-20: 10 ug/min via INTRAVENOUS

## 2019-03-20 MED ORDER — SODIUM CHLORIDE 0.9 % IV SOLN
INTRAVENOUS | Status: DC
  Administered 2019-03-20: 12:00:00 via INTRAVENOUS

## 2019-03-20 MED ORDER — PROPOFOL 10 MG/ML IV BOLUS
INTRAVENOUS | Status: AC
  Filled 2019-03-20: qty 20

## 2019-03-20 MED ORDER — NEOMYCIN-POLYMYXIN B GU 40-200000 IR SOLN
Status: DC | PRN
  Administered 2019-03-20: 2 mL

## 2019-03-20 MED ORDER — LIDOCAINE HCL (CARDIAC) PF 100 MG/5ML IV SOSY
PREFILLED_SYRINGE | INTRAVENOUS | Status: DC | PRN
  Administered 2019-03-20: 100 mg via INTRAVENOUS

## 2019-03-20 MED ORDER — NEOMYCIN-POLYMYXIN B GU 40-200000 IR SOLN
Status: AC
  Filled 2019-03-20: qty 20

## 2019-03-20 MED ORDER — FENTANYL CITRATE (PF) 100 MCG/2ML IJ SOLN: 25.0000 ug | INTRAMUSCULAR | Status: DC | PRN

## 2019-03-20 MED ORDER — CEFAZOLIN SODIUM-DEXTROSE 2-4 GM/100ML-% IV SOLN
INTRAVENOUS | Status: AC
  Filled 2019-03-20: qty 100

## 2019-03-20 MED ORDER — ONDANSETRON HCL 4 MG/2ML IJ SOLN
INTRAMUSCULAR | Status: DC | PRN
  Administered 2019-03-20: 4 mg via INTRAVENOUS

## 2019-03-20 MED ORDER — LIDOCAINE HCL (PF) 1 % IJ SOLN
INTRAMUSCULAR | Status: DC | PRN
  Administered 2019-03-20: 3 mL

## 2019-03-20 MED ORDER — FAMOTIDINE 20 MG PO TABS
20.0000 mg | ORAL_TABLET | Freq: Once | ORAL | Status: AC
  Administered 2019-03-20: 14:00:00 20 mg via ORAL

## 2019-03-20 MED ORDER — FENTANYL CITRATE (PF) 100 MCG/2ML IJ SOLN
50.0000 ug | Freq: Once | INTRAMUSCULAR | Status: AC
  Administered 2019-03-20: 14:00:00 50 ug via INTRAVENOUS

## 2019-03-20 MED ORDER — ONDANSETRON 4 MG PO TBDP: 4.0000 mg | ORAL_TABLET | Freq: Three times a day (TID) | ORAL | 0 refills | Status: DC | PRN

## 2019-03-20 MED ORDER — CHLORHEXIDINE GLUCONATE 4 % EX LIQD: 60.0000 mL | Freq: Once | CUTANEOUS | Status: DC

## 2019-03-20 MED ORDER — ACETAMINOPHEN 500 MG PO TABS: 1000.0000 mg | ORAL_TABLET | Freq: Three times a day (TID) | ORAL | 2 refills | Status: DC

## 2019-03-20 MED ORDER — FENTANYL CITRATE (PF) 100 MCG/2ML IJ SOLN
INTRAMUSCULAR | Status: AC
  Filled 2019-03-20: qty 2

## 2019-03-20 MED ORDER — ASPIRIN EC 325 MG PO TBEC: 325.0000 mg | DELAYED_RELEASE_TABLET | Freq: Every day | ORAL | 0 refills | Status: AC

## 2019-03-20 MED ORDER — CEFAZOLIN SODIUM-DEXTROSE 2-4 GM/100ML-% IV SOLN
2.0000 g | INTRAVENOUS | Status: AC
  Administered 2019-03-20: 15:00:00 2 g via INTRAVENOUS

## 2019-03-20 MED ORDER — FENTANYL CITRATE (PF) 100 MCG/2ML IJ SOLN
INTRAMUSCULAR | Status: DC | PRN
  Administered 2019-03-20 (×2): 50 ug via INTRAVENOUS

## 2019-03-20 MED ORDER — ROCURONIUM BROMIDE 100 MG/10ML IV SOLN
INTRAVENOUS | Status: DC | PRN
  Administered 2019-03-20 (×2): 10 mg via INTRAVENOUS
  Administered 2019-03-20: 40 mg via INTRAVENOUS
  Administered 2019-03-20 (×2): 10 mg via INTRAVENOUS

## 2019-03-20 SURGICAL SUPPLY — 47 items
BNDG COHESIVE 4X5 TAN STRL (GAUZE/BANDAGES/DRESSINGS) ×3 IMPLANT
BNDG ELASTIC 4X5.8 VLCR STR LF (GAUZE/BANDAGES/DRESSINGS) ×3 IMPLANT
BNDG ESMARK 4X12 TAN STRL LF (GAUZE/BANDAGES/DRESSINGS) ×3 IMPLANT
CANISTER SUCT 1200ML W/VALVE (MISCELLANEOUS) ×3 IMPLANT
CHLORAPREP W/TINT 26 (MISCELLANEOUS) ×3 IMPLANT
CORD BIP STRL DISP 12FT (MISCELLANEOUS) ×3 IMPLANT
COVER WAND RF STERILE (DRAPES) ×3 IMPLANT
CUFF TOURN SGL QUICK 18X4 (TOURNIQUET CUFF) ×3 IMPLANT
CUFF TOURN SGL QUICK 24 (TOURNIQUET CUFF)
CUFF TRNQT CYL 24X4X16.5-23 (TOURNIQUET CUFF) IMPLANT
DERMABOND ADVANCED (GAUZE/BANDAGES/DRESSINGS) ×2
DERMABOND ADVANCED .7 DNX12 (GAUZE/BANDAGES/DRESSINGS) ×1 IMPLANT
DRAPE FLUOR MINI C-ARM 54X84 (DRAPES) ×3 IMPLANT
DRAPE SURG 17X11 SM STRL (DRAPES) ×3 IMPLANT
DRAPE U-SHAPE 47X51 STRL (DRAPES) ×3 IMPLANT
ELECT REM PT RETURN 9FT ADLT (ELECTROSURGICAL) ×3
ELECTRODE REM PT RTRN 9FT ADLT (ELECTROSURGICAL) ×1 IMPLANT
FORCEPS JEWEL BIP 4-3/4 STR (INSTRUMENTS) ×6 IMPLANT
GAUZE SPONGE 4X4 12PLY STRL (GAUZE/BANDAGES/DRESSINGS) ×3 IMPLANT
GLOVE INDICATOR 8.0 STRL GRN (GLOVE) ×6 IMPLANT
GLOVE SURG ORTHO 8.0 STRL STRW (GLOVE) ×9 IMPLANT
GOWN STRL REUS W/ TWL LRG LVL3 (GOWN DISPOSABLE) ×2 IMPLANT
GOWN STRL REUS W/TWL LRG LVL3 (GOWN DISPOSABLE) ×4
KIT TURNOVER KIT A (KITS) ×3 IMPLANT
LOOP VESSEL MINI 0.8X406 BLUE (MISCELLANEOUS) IMPLANT
LOOP VESSEL SUPERMAXI WHITE (MISCELLANEOUS) IMPLANT
LOOPS BLUE MINI 0.8X406MM (MISCELLANEOUS)
NDL MAYO CATGUT SZ5 (NEEDLE)
NDL SUT 5 .5 CRC TPR PNT MAYO (NEEDLE) IMPLANT
PACK EXTREMITY ARMC (MISCELLANEOUS) ×3 IMPLANT
PAD CAST CTTN 4X4 STRL (SOFTGOODS) ×1 IMPLANT
PADDING CAST 4IN STRL (MISCELLANEOUS) ×4
PADDING CAST BLEND 4X4 STRL (MISCELLANEOUS) ×2 IMPLANT
PADDING CAST COTTON 4X4 STRL (SOFTGOODS) ×2
SLING ARM LRG DEEP (SOFTGOODS) ×3 IMPLANT
STOCKINETTE IMPERVIOUS 9X36 MD (GAUZE/BANDAGES/DRESSINGS) ×3 IMPLANT
SUT 2 FIBERLOOP 20 STRT BLUE (SUTURE) ×3
SUT MNCRL 4-0 (SUTURE) ×2
SUT MNCRL 4-0 27XMFL (SUTURE) ×1
SUT MNCRL AB 4-0 PS2 18 (SUTURE) ×3 IMPLANT
SUT VIC AB 2-0 SH 27 (SUTURE) ×2
SUT VIC AB 2-0 SH 27XBRD (SUTURE) ×1 IMPLANT
SUT VICRYL 3-0 27IN (SUTURE) ×3 IMPLANT
SUTURE 2 FIBERLOOP 20 STRT BLU (SUTURE) ×1 IMPLANT
SUTURE MNCRL 4-0 27XMF (SUTURE) ×1 IMPLANT
SYS IMPL DISTAL BICEP BONE (Shoulder) ×3 IMPLANT
SYSTEM IMPL DISTAL BICEP BONE (Shoulder) ×1 IMPLANT

## 2019-03-20 NOTE — Anesthesia Procedure Notes (Signed)
Anesthesia Regional Block: Supraclavicular block   Pre-Anesthetic Checklist: ,, timeout performed, Correct Patient, Correct Site, Correct Laterality, Correct Procedure, Correct Position, site marked, Risks and benefits discussed,  Surgical consent,  Pre-op evaluation,  At surgeon's request and post-op pain management  Laterality: Right  Prep: chloraprep       Needles:  Injection technique: Single-shot  Needle Type: Stimiplex     Needle Length: 10cm  Needle Gauge: 21     Additional Needles:   Procedures:,,,, ultrasound used (permanent image in chart),,,,  Narrative:  Start time: 03/20/2019 2:13 PM End time: 03/20/2019 2:17 PM Injection made incrementally with aspirations every 5 mL.  Performed by: Personally  Anesthesiologist: Emmie Niemann, MD  Additional Notes: Functioning IV was confirmed and monitors were applied.  A Stimuplex needle was used. Sterile prep and drape,hand hygiene and sterile gloves were used.  Negative aspiration and negative test dose prior to incremental administration of local anesthetic. The patient tolerated the procedure well.

## 2019-03-20 NOTE — H&P (Signed)
Paper H&P to be scanned into permanent record. H&P reviewed. No significant changes noted.  

## 2019-03-20 NOTE — Anesthesia Procedure Notes (Signed)
Procedure Name: Intubation Date/Time: 03/20/2019 2:30 PM Performed by: Lily Peer, [place], RN Pre-anesthesia Checklist: Patient identified, Patient being monitored, Timeout performed, Emergency Drugs available and Suction available Patient Re-evaluated:Patient Re-evaluated prior to induction Oxygen Delivery Method: Circle system utilized Preoxygenation: Pre-oxygenation with 100% oxygen Induction Type: IV induction Ventilation: Mask ventilation without difficulty Laryngoscope Size: McGraph and 4 Grade View: Grade I Tube type: Oral Tube size: 7.5 mm Number of attempts: 1 Airway Equipment and Method: Stylet Placement Confirmation: ETT inserted through vocal cords under direct vision,  positive ETCO2 and breath sounds checked- equal and bilateral Secured at: 23 cm Tube secured with: Tape Dental Injury: Teeth and Oropharynx as per pre-operative assessment

## 2019-03-20 NOTE — Anesthesia Preprocedure Evaluation (Addendum)
Anesthesia Evaluation  Patient identified by MRN, date of birth, ID band Patient awake    Reviewed: Allergy & Precautions, H&P , NPO status , Patient's Chart, lab work & pertinent test results  History of Anesthesia Complications Negative for: history of anesthetic complications  Airway Mallampati: III  TM Distance: >3 FB Neck ROM: Full    Dental no notable dental hx.    Pulmonary neg pulmonary ROS, neg sleep apnea, neg COPD,    breath sounds clear to auscultation- rhonchi (-) wheezing      Cardiovascular hypertension, Pt. on medications (-) CAD, (-) Past MI, (-) Cardiac Stents and (-) CABG  Rhythm:Regular Rate:Normal - Systolic murmurs and - Diastolic murmurs    Neuro/Psych neg Seizures negative neurological ROS  negative psych ROS   GI/Hepatic negative GI ROS, Neg liver ROS,   Endo/Other  diabetes, Type 2  Renal/GU CRFRenal disease     Musculoskeletal   Abdominal (+) + obese,   Peds  Hematology negative hematology ROS (+)   Anesthesia Other Findings Obesity BMI 38  Past Medical History: No date: HIV (human immunodeficiency virus infection) (HCC)  Past Surgical History: No date: SKIN SURGERY     Reproductive/Obstetrics negative OB ROS                            Anesthesia Physical Anesthesia Plan  ASA: III  Anesthesia Plan: General ETT   Post-op Pain Management:  Regional for Post-op pain   Induction: Intravenous  PONV Risk Score and Plan: 1 and Ondansetron, Dexamethasone, Midazolam and Treatment may vary due to age or medical condition  Airway Management Planned: Oral ETT  Additional Equipment:   Intra-op Plan:   Post-operative Plan: Extubation in OR  Informed Consent: I have reviewed the patients History and Physical, chart, labs and discussed the procedure including the risks, benefits and alternatives for the proposed anesthesia with the patient or authorized  representative who has indicated his/her understanding and acceptance.     Dental Advisory Given  Plan Discussed with: Anesthesiologist  Anesthesia Plan Comments:       Anesthesia Quick Evaluation

## 2019-03-20 NOTE — Anesthesia Postprocedure Evaluation (Signed)
Anesthesia Post Note  Patient: George Bush  Procedure(s) Performed: DISTAL BICEPS TENDON REPAIR (Right )  Patient location during evaluation: PACU Anesthesia Type: General Level of consciousness: awake and alert Pain management: pain level controlled Vital Signs Assessment: post-procedure vital signs reviewed and stable Respiratory status: spontaneous breathing, nonlabored ventilation, respiratory function stable and patient connected to nasal cannula oxygen Cardiovascular status: blood pressure returned to baseline and stable Postop Assessment: no apparent nausea or vomiting Anesthetic complications: no     Last Vitals:  Vitals:   03/20/19 1835 03/20/19 1836  BP:  130/87  Pulse: 76 78  Resp:    Temp:    SpO2: 99% 99%    Last Pain:  Vitals:   03/20/19 1830  TempSrc:   PainSc: 0-No pain                 Kharlie Bring S

## 2019-03-20 NOTE — Transfer of Care (Signed)
Immediate Anesthesia Transfer of Care Note  Patient: George Bush  Procedure(s) Performed: DISTAL BICEPS TENDON REPAIR (Right )  Patient Location: PACU  Anesthesia Type:General  Level of Consciousness: awake, alert  and oriented  Airway & Oxygen Therapy: Patient Spontanous Breathing and Patient connected to face mask oxygen  Post-op Assessment: Report given to RN and Post -op Vital signs reviewed and stable  Post vital signs: Reviewed and stable  Last Vitals:  Vitals Value Taken Time  BP 122/79 03/20/19 1734  Temp 36.2 C 03/20/19 1734  Pulse 84 03/20/19 1741  Resp 23 03/20/19 1741  SpO2 100 % 03/20/19 1741  Vitals shown include unvalidated device data.  Last Pain:  Vitals:   03/20/19 1734  TempSrc:   PainSc: Asleep         Complications: No apparent anesthesia complications

## 2019-03-20 NOTE — Anesthesia Post-op Follow-up Note (Signed)
Anesthesia QCDR form completed.        

## 2019-03-20 NOTE — Discharge Instructions (Signed)
AMBULATORY SURGERY  DISCHARGE INSTRUCTIONS   1) The drugs that you were given will stay in your system until tomorrow so for the next 24 hours you should not:  A) Drive an automobile B) Make any legal decisions C) Drink any alcoholic beverage   2) You may resume regular meals tomorrow.  Today it is better to start with liquids and gradually work up to solid foods.  You may eat anything you prefer, but it is better to start with liquids, then soup and crackers, and gradually work up to solid foods.   3) Please notify your doctor immediately if you have any unusual bleeding, trouble breathing, redness and pain at the surgery site, drainage, fever, or pain not relieved by medication. 4)   5) Your post-operative visit with Dr.                                     is: Date:                        Time:    Please call to schedule your post-operative visit.  6) Additional Instructions:     Post-Op Instructions - Distal Biceps Repair  1. Bracing: You will wear a sling for 3-4 days until your first PT appointment. Wean from sling after PT visit. Gradually attempt full extension.   2. Driving: No driving for at least 2 weeks post-op.   3. Activity:  - After 1st PT visit: Progress towards full passive RoM - 1 week - Can start active RoM - 2 weeks - Can start strengthening with 1 lb weights - 3 weeks - ADLs and active motion as tolerated. No more than 5 lb lifting. - 4 weeks - can gradually resume most normal activities ie raking leaves, mowing yard, shooting basketball, driving, etc. No more than 10 lb lifting. - 6 weeks - Can start lifting heavier weights (~20-30 lbs) - 2-3 months - Can lift gym type weights (~80-100 lbs)  4. Physical Therapy: Begins 3-4 days after surgery  5. Medications:  - You will be provided a prescription for narcotic pain medicine. After surgery, take 1-2 narcotic tablets every 4 hours if needed for severe pain.  - A prescription for anti-nausea  medication will be provided in case the narcotic medicine causes nausea - take 1 tablet every 6 hours only if nauseated.   - Take ASA 325mg /day x 2 weeks to help prevent DVTs/PEs (blood clots).  - Take Tylenol 1000mg  (2 extra strength tablets or 3 regular strength tablets) three times/day until your pain improves. This will reduce the amount of narcotic pain medication needed.   If you are taking prescription medication for anxiety, depression, insomnia, muscle spasm, chronic pain, or for attention deficit disorder, you are advised that you are at a higher risk of adverse effects with use of narcotics post-op, including narcotic addiction/dependence, depressed breathing, death. If you use non-prescribed substances: alcohol, marijuana, cocaine, heroin, methamphetamines, etc., you are at a higher risk of adverse effects with use of narcotics post-op, including narcotic addiction/dependence, depressed breathing, death. You are advised that taking > 50 morphine milligram equivalents (MME) of narcotic pain medication per day results in twice the risk of overdose or death. For your prescription provided: hydrocodone 5 mg - taking more than 8 tablets per day would result in > 50 morphine milligram equivalents (MME) of narcotic pain medication. Be advised  that we will prescribe narcotics short-term, for acute post-operative pain only - 3 weeks for major operations such as shoulder repair/reconstruction surgeries.   6. Post-Op Appointment:  Your first post-op appointment will be 10-14 days post-op.  7. Work or School: For most, but not all procedures, we advise staying out of work or school for at least 1 to 2 weeks in order to recover from the stress of surgery and to allow time for healing.   If you need a work or school note this can be provided.   8. Smoking: If you are a smoker, you need to refrain from smoking in the postoperative period. The nicotine in cigarettes will inhibit healing of your shoulder  repair and decrease the chance of successful repair. Similarly, nicotine containing products (gum, patches) should be avoided.

## 2019-03-20 NOTE — Op Note (Addendum)
Operative Note    SURGERY DATE: 03/20/2019   PRE-OP DIAGNOSIS:  1. Right distal biceps rupture   POST-OP DIAGNOSIS:  1. Right distal biceps rupture  PROCEDURES:  1. Right distal biceps repair   SURGEON: Rosealee Albee, MD  ASSISTANT: Sonny Dandy, PA   ANESTHESIA: Gen with Exparel interscalene block   ESTIMATED BLOOD LOSS: 10cc   TOTAL IV FLUIDS: see anesthesia record  IMPLANTS: Arthrex biceps button and 52mm interference screw   INDICATION(S):  George Bush is a 53 y.o. male who sustained a distal biceps rupture while picking up pieces of a fence when he felt a pop over his right antecubital fossa.  Injury occurred on on 02/24/2019.  Clinical exam and MRI findings confirmed diagnosis.  After discussion of risks, benefits, and alternatives to surgery, the patient and family elected to proceed with distal biceps repair   OPERATIVE FINDINGS: Right distal biceps rupture   OPERATIVE REPORT:   I identified George Bush in the pre-operative holding area. Informed consent was obtained and the surgical site was marked. I reviewed the risks and benefits of the proposed surgical intervention and the patient (and/or patient's guardian) wished to proceed.  Anesthesia team administered an Exparel interscalene block.  The patient was transferred to the operative suite and general anesthesia was administered. The patient was placed in the supine position with arm on an arm board.  All down side pressure points were appropriately padded. Appropriate IV antibiotics were administered within 30 minutes before incision. The extremity was then prepped and draped in standard fashion. A time out was performed confirming the correct extremity, correct patient, and correct procedure.   A 6 cm transverse incision was made approximately 3 cm distal to the elbow flexion crease.  Care was taken to carefully dissect down to the fascial layer of the brachioradialis and pronator teres.  The lateral  antebrachial cutaneous nerve was identified.  It was then mobilized and carefully retracted laterally without placing undue tension on the nerve.  The fascial layer was then incised.  Dissection was carried proximally to the biceps muscle and retracted tendon.  The tendon could not be easily identified.  Therefore a second incision approximately 4 cm proximal to the antecubital fossa was made overlying the biceps tendon near the musculotendinous junction.  The biceps muscle was identified.  It was traced down to where the tendon should be, but again no tendon was identified.  Further dissection to the distal end of this revealed that the biceps tendon had folded on itself proximally and was encased in scar tissue.  The biceps tendon itself was then mobilized. The ends of the tendon were freshened with a 15 blade and tapered to allow for easier passage at the time of fixation.  A fiber loop suture was placed along the distal 25 mm of the tendon.  The tendon was sized to 7 mm.  The tendon was then wrapped in saline gauze and retracted proximally.  Deep dissection was carefully carried out to the level of the radial tuberosity using fluoroscopy for confirmation.  Care was taken to not retract on the lateral and posterior side of the radius to avoid injuring the posterior interosseous nerve.  A guidepin was placed in the center of the radial tuberosity aiming in a approximately 30 degree direction towards the ulna to avoid injuring the posterior interosseous nerve. Position was confirmed with fluoroscopy.  An 8 mm reamer was used to drill a unicortical socket over the guidepin.  All  bony debris from this was removed to use the risk of heterotopic ossification.  The wound was then irrigated.  The suture tails of the fiber loop on the biceps tendon were then passed through the biceps button to allow for a tension slide technique.  The button was then inserted through the radial tuberosity to rest on the far cortex of the  radius.  This was confirmed using fluoroscopy, and the fiberloop sutures were then pulled to advance the biceps tendon into the socket.  Despite attempts to remobilize the proximal end of the tendon, the biceps tendon cannot be seated in the radial tuberosity socket with the arm in extension.  I was able to seat 10 mm of the biceps tendon into the socket with the arm held in approximately 40 degrees of flexion.  A free needle was used to pass one of the limbs through the biceps tendon at the aperture and a knot was tied.  Next, the 7 mm interference screw was then placed on the radial side of the socket to push the biceps tendon towards its more anatomical position on the ulnar side of the radial tuberosity.  A free limb was again passed to the biceps tendon using a free needle and a knot was tied over this.  The elbow was taken through a range of motion and there was no gapping or movement of the biceps tendon.  The patient lacked 0 to 20 degrees elbow flexion due to the tension with the repair.  Additionally the patient's hook test clinically was now normal.  The tourniquet was let down with a tourniquet time of 118 minutes.  There was no significant bleeding.  The wounds were copiously irrigated.  The subdermal layer was closed with 3-0 Vicryl and skin was closed with a running subcuticular 4-0 Monocryl.  Dermabond was applied.  A sterile, soft dressing was applied.  A sling was placed. Patient was extubated, transferred to a stretcher bed and to the post anesthesia care unit in stable condition.   Additionally, this case had increased complexity compared to standard distal biceps repair given the chronicity of the injury.  This caused increased retraction of the tendon with significant increased scar tissue formation.  This necessitated a second incision and further dissection to safely identify the biceps tendon.  Overall, this increased surgical time by approximately 45 minutes.  Of note, assistance from  a PA was essential to performing the surgery.  PA was present for the entire surgery.  PA assisted with patient positioning, retraction, instrumentation, and wound closure. The surgery would have been more difficult and had longer operative time without PA assistance.    POSTOPERATIVE PLAN: The patient will be discharged home from PACU. Operative arm to remain in sling until outpatient PT, which will start in 3-4 days. Patient to return to clinic in ~2 weeks for post-operative appointment.    REHAB PROTOCOL: - After 1st PT visit: Progress towards full passive RoM.  Full extension may be more difficult to achieve immediately given position of arm during repair. - 1 week - Can start active RoM - 2 weeks - Can start strengthening with 1 lb weights - 3 weeks - ADLs and active motion as tolerated. No more than 5 lb lifting. - 4 weeks - can gradually resume most normal activities ie raking leaves, mowing yard, shooting basketball, driving, etc. No more than 10 lb lifting. - 6 weeks - Can start lifting heavier weights (~20-30 lbs) - 2-3 months - Can lift gym  type weights (~80-100 lbs)

## 2019-03-21 ENCOUNTER — Encounter: Payer: Self-pay | Admitting: Orthopedic Surgery

## 2019-03-24 ENCOUNTER — Other Ambulatory Visit: Payer: Self-pay

## 2019-03-24 ENCOUNTER — Emergency Department
Admission: EM | Admit: 2019-03-24 | Discharge: 2019-03-24 | Disposition: A | Discharge status: Home / Self Care | Payer: BC Managed Care – PPO | Attending: Emergency Medicine | Admitting: Emergency Medicine

## 2019-03-24 ENCOUNTER — Emergency Department: Payer: BC Managed Care – PPO

## 2019-03-24 ENCOUNTER — Encounter: Payer: Self-pay | Admitting: Emergency Medicine

## 2019-03-24 DIAGNOSIS — Z7982 Long term (current) use of aspirin: Secondary | ICD-10-CM | POA: Insufficient documentation

## 2019-03-24 DIAGNOSIS — B20 Human immunodeficiency virus [HIV] disease: Secondary | ICD-10-CM | POA: Insufficient documentation

## 2019-03-24 DIAGNOSIS — Z7984 Long term (current) use of oral hypoglycemic drugs: Secondary | ICD-10-CM | POA: Diagnosis not present

## 2019-03-24 DIAGNOSIS — H35033 Hypertensive retinopathy, bilateral: Secondary | ICD-10-CM | POA: Insufficient documentation

## 2019-03-24 DIAGNOSIS — Z79899 Other long term (current) drug therapy: Secondary | ICD-10-CM | POA: Diagnosis not present

## 2019-03-24 DIAGNOSIS — M7989 Other specified soft tissue disorders: Secondary | ICD-10-CM

## 2019-03-24 DIAGNOSIS — E119 Type 2 diabetes mellitus without complications: Secondary | ICD-10-CM | POA: Diagnosis not present

## 2019-03-24 DIAGNOSIS — I10 Essential (primary) hypertension: Secondary | ICD-10-CM | POA: Diagnosis not present

## 2019-03-24 NOTE — ED Triage Notes (Signed)
States had surgery R elbow on tendons 4 days ago. States today he noted swelling in upper arm above ace wrap. This area is palpated and no firm lump felt and is not tender to touch.

## 2019-03-24 NOTE — ED Provider Notes (Signed)
[facility] Emergency Department Provider Note  ____________________________________________  Time seen: Approximately 8:33 PM  I have reviewed the triage vital signs and the nursing notes.   HISTORY  Chief Complaint Post-op Problem    HPI George Bush is a 53 y.o. male s/p biceps tendon repair on Tuesday 11/24 that presents to the emergency department for evaluation of swelling to right inner arm above his elbow after playing poker today.  Patient states that he was instructed that he could slightly use his right arm following surgery.  He took his sling off and was playing poker, and he was using that arm to deal out cards. Afterwards noticed some swelling to his upper arm.  It is not in the same area that he had his previous injury.  It is not painful.  He never heard a pop.  No fevers.  No drainage from sites.  He has supposed to start physical therapy on Monday.  He is not scheduled to refollow-up with orthopedics until December 9.  No numbness, tingling, weakness.  Past Medical History:  Diagnosis Date  . HIV (human immunodeficiency virus infection) [facility])     Patient Active Problem List   Diagnosis Date Noted  . Erectile dysfunction due to arterial insufficiency 10/10/2017  . Hypogonadism in male 10/10/2017  . Hypertensive retinopathy of both eyes, grade 2 10/04/2016  . B12 deficiency 10/29/2015  . Healthcare maintenance 11/27/2012  . AIN grade II 11/21/2012  . Dyslipidemia 11/21/2012  . Elevated lipase 11/21/2012  . HIV (human immunodeficiency virus infection) (HCC) 11/21/2012  . HTN (hypertension) 11/21/2012  . Obesity 11/21/2012  . Type 2 diabetes mellitus with diabetic nephropathy (HCC) 11/21/2012  . Condyloma acuminata 04/28/2011    Past Surgical History:  Procedure Laterality Date  . DISTAL BICEPS TENDON REPAIR Right 03/20/2019   Procedure: DISTAL BICEPS TENDON REPAIR;  Surgeon: Signa Kell, MD;  Location: [facility] ORS;  Service:  Orthopedics;  Laterality: Right;  . SKIN SURGERY      Prior to Admission medications   Medication Sig Start Date End Date Taking? Authorizing Provider  acetaminophen (TYLENOL) 500 MG tablet Take 2 tablets (1,000 mg total) by mouth every 8 (eight) hours. 03/20/19 03/19/20  Signa Kell, MD  aspirin EC 325 MG tablet Take 1 tablet (325 mg total) by mouth daily for 14 days. 03/20/19 04/03/19  Signa Kell, MD  atorvastatin (LIPITOR) 40 MG tablet Take 40 mg by mouth daily.  11/18/16   [provider]  elvitegravir-cobicistat-emtricitabine-tenofovir (GENVOYA) 150-150-200-10 MG TABS tablet Take 1 tablet by mouth daily.  07/05/16   [provider]  lisinopril-hydrochlorothiazide (PRINZIDE,ZESTORETIC) 20-25 MG tablet Take 1 tablet by mouth daily.  10/05/16   [provider]  metFORMIN (GLUCOPHAGE) 1000 MG tablet Take 1,000 mg by mouth 2 (two) times daily.     [provider]  ondansetron (ZOFRAN ODT) 4 MG disintegrating tablet Take 1 tablet (4 mg total) by mouth every 8 (eight) hours as needed for nausea or vomiting. 03/20/19   Signa Kell, MD  oxyCODONE (ROXICODONE) 5 MG immediate release tablet Take 1-2 tablets (5-10 mg total) by mouth every 4 (four) hours as needed (pain). 03/20/19 03/19/20  Signa Kell, MD  OZEMPIC, 1 MG/DOSE, 2 MG/1.5ML SOPN Inject 1 mg into the skin every Thursday. 03/14/19   [provider]  sildenafil (REVATIO) 20 MG tablet Take 2-5 tablets as needed for erectile dysfunction. 02/08/19   Stoioff, Verna Czech, MD  testosterone cypionate (DEPOTESTOSTERONE CYPIONATE) 200 MG/ML injection Inject 1 mL (  200 mg total) into the muscle every 14 (fourteen) days. 12/17/18   Stoioff, Verna CzechScott C, MD  TRESIBA 100 UNIT/ML SOLN Inject 50 Units into the skin at bedtime.    [provider]  vitamin B-12 (CYANOCOBALAMIN) 500 MCG tablet Take 1,000 mcg by mouth daily.    [provider]    Allergies Patient has no known allergies.  No family  history on file.  Social History Social History   Tobacco Use  . Smoking status: Never Smoker  . Smokeless tobacco: Never Used  Substance Use Topics  . Alcohol use: No    Frequency: Never  . Drug use: No     Review of Systems  Constitutional: No fever/chills Respiratory: No SOB. Gastrointestinal: No abdominal pain.  No nausea, no vomiting.  Musculoskeletal: Negative for musculoskeletal pain. Skin: Negative for rash, abrasions, lacerations, ecchymosis.   ____________________________________________   PHYSICAL EXAM:  VITAL SIGNS: ED Triage Vitals  Enc Vitals Group     BP 03/24/19 1739 (!) 135/98     Pulse Rate 03/24/19 1739 90     Resp 03/24/19 1739 18     Temp 03/24/19 1739 99.1 F (37.3 C)     Temp Source 03/24/19 1739 Oral     SpO2 03/24/19 1739 97 %     Weight 03/24/19 1740 267 lb (121.1 kg)     Height 03/24/19 1740 5' 10.5" (1.791 m)     Head Circumference --      Peak Flow --      Pain Score 03/24/19 1740 0     Pain Loc --      Pain Edu? --      Excl. in GC? --      Constitutional: Alert and oriented. Well appearing and in no acute distress. Eyes: Conjunctivae are normal. PERRL. EOMI. Head: Atraumatic. ENT:      Ears:      Nose: No congestion/rhinnorhea.      Mouth/Throat: Mucous membranes are moist.  Neck: No stridor.   Cardiovascular: Normal rate, regular rhythm.  Good peripheral circulation.  Symmetric radial pulses bilaterally. Respiratory: Normal respiratory effort without tachypnea or retractions. Lungs CTAB. Good air entry to the bases with no decreased or absent breath sounds. Musculoskeletal: Full range of motion to all extremities. No gross deformities appreciated.  Mild swelling to right distal medial triceps.  No tenderness to palpation to distal biceps.  Able to flex and extend at the elbow. Neurologic:  Normal speech and language. No gross focal neurologic deficits are appreciated.  Skin:  Skin is warm, dry and intact.  Psychiatric: Mood  and affect are normal. Speech and behavior are normal. Patient exhibits appropriate insight and judgement.   ____________________________________________   LABS (all labs ordered are listed, but only abnormal results are displayed)  Labs Reviewed - No data to display ____________________________________________  EKG   ____________________________________________  RADIOLOGY   [place] Venous Img Upper Uni Right(dvt)  Result Date: 03/24/2019 CLINICAL DATA:  53 year old male with right arm swelling. Recent surgery. EXAM: Right UPPER EXTREMITY VENOUS DOPPLER ULTRASOUND TECHNIQUE: Gray-scale sonography with graded compression, as well as color Doppler and duplex ultrasound were performed to evaluate the upper extremity deep venous system from the level of the subclavian vein and including the jugular, axillary, basilic, radial, ulnar and upper cephalic vein. Spectral Doppler was utilized to evaluate flow at rest and with distal augmentation maneuvers. COMPARISON:  None. FINDINGS: Contralateral Subclavian Vein: Respiratory phasicity is normal and symmetric with the symptomatic side. No evidence of  thrombus. Normal compressibility. Internal Jugular Vein: No evidence of thrombus. Normal compressibility, respiratory phasicity and response to augmentation. Subclavian Vein: No evidence of thrombus. Normal compressibility, respiratory phasicity and response to augmentation. Axillary Vein: No evidence of thrombus. Normal compressibility, respiratory phasicity and response to augmentation. Cephalic Vein: No evidence of thrombus. Normal compressibility, respiratory phasicity and response to augmentation. Basilic Vein: No evidence of thrombus. Normal compressibility, respiratory phasicity and response to augmentation. Brachial Veins: No evidence of thrombus. Normal compressibility, respiratory phasicity and response to augmentation. Radial Veins: No evidence of thrombus. Normal compressibility, respiratory phasicity  and response to augmentation. Ulnar Veins: No evidence of thrombus. Normal compressibility, respiratory phasicity and response to augmentation. Venous Reflux:  None visualized. Other Findings: There is subcutaneous edema in the region of the swelling. No drainable fluid collection. IMPRESSION: No evidence of DVT within the right upper extremity. Electronically Signed   By: Anner Crete M.D.   On: 03/24/2019 20:38    ____________________________________________    PROCEDURES  Procedure(s) performed:    Procedures    Medications - No data to display   ____________________________________________   INITIAL IMPRESSION / ASSESSMENT AND PLAN / ED COURSE  Pertinent labs & imaging results that were available during my care of the patient were reviewed by me and considered in my medical decision making (see chart for details).  Review of the [place] CSRS was performed in accordance of the [place] prior to dispensing any controlled drugs.   Patient presented to the emergency department for evaluation of swelling today following a biceps tendon repair on Tuesday.  Vital signs and exam are reassuring.  No indication of bacterial infection.  Ultrasound negative for DVT.  Patient does not seem to have pain or swelling over the insertion point of the biceps tendon.  We discussed doing an additional ultrasound to further evaluate area and for possible reinjury.  Patient elects to follow-up with Dr. Posey Pronto on Monday for recheck.  Patient will continue to use Ace wrap and wear sling.  Patient is to follow up with orthopedics as directed. Patient is given ED precautions to return to the ED for any worsening or new symptoms.   DAIQUAN RESNIK was evaluated in Emergency Department on 03/24/2019 for the symptoms described in the history of present illness. He was evaluated in the context of the global COVID-19 pandemic, which necessitated consideration that the patient might be at risk for infection with  the SARS-CoV-2 virus that causes COVID-19. Institutional protocols and algorithms that pertain to the evaluation of patients at risk for COVID-19 are in a state of rapid change based on information released by regulatory bodies including the CDC and federal and state organizations. These policies and algorithms were followed during the patient's care in the ED.  ____________________________________________  FINAL CLINICAL IMPRESSION(S) / ED DIAGNOSES  Final diagnoses:  Arm swelling      NEW MEDICATIONS STARTED DURING THIS VISIT:  ED Discharge Orders    None          This chart was dictated using voice recognition software/Dragon. Despite best efforts to proofread, errors can occur which can change the meaning. Any change was purely unintentional.    Laban Emperor, PA-C 03/24/19 2143    Harvest Dark, MD 03/24/19 2243

## 2019-03-24 NOTE — Discharge Instructions (Addendum)
Your ultrasound did not show a DVT.  Continue using your ace wrap and your arm sling.  Limit the use of your right arm.  Please call orthopedics on Monday for an appointment for reevaluation.

## 2019-03-29 ENCOUNTER — Other Ambulatory Visit: Payer: Self-pay

## 2019-03-29 ENCOUNTER — Ambulatory Visit (INDEPENDENT_AMBULATORY_CARE_PROVIDER_SITE_OTHER): Payer: BC Managed Care – PPO | Admitting: *Deleted

## 2019-03-29 DIAGNOSIS — E291 Testicular hypofunction: Secondary | ICD-10-CM

## 2019-03-29 DIAGNOSIS — N5201 Erectile dysfunction due to arterial insufficiency: Secondary | ICD-10-CM | POA: Diagnosis not present

## 2019-03-29 MED ORDER — TESTOSTERONE CYPIONATE 200 MG/ML IM SOLN
200.0000 mg | Freq: Once | INTRAMUSCULAR | Status: AC
  Administered 2019-03-29: 200 mg via INTRAMUSCULAR

## 2019-03-29 NOTE — Progress Notes (Signed)
Due to Hypogonadism patient is present today for a Testosterone Injection.  Medication: Testosterone Cypionate Dose: 41ml Location: left upper outer buttocks Lot: [id number] Exp:02/2020  Patient tolerated well, no complications were noted  Preformed by: Gaspar Cola, CMA  Follow up: 2 weeks

## 2019-04-06 ENCOUNTER — Other Ambulatory Visit: Payer: Self-pay

## 2019-04-06 DIAGNOSIS — Z20822 Contact with and (suspected) exposure to covid-19: Secondary | ICD-10-CM

## 2019-04-07 ENCOUNTER — Telehealth: Payer: Self-pay

## 2019-04-07 LAB — NOVEL CORONAVIRUS, NAA: SARS-CoV-2, NAA: DETECTED — AB

## 2019-04-07 NOTE — Telephone Encounter (Signed)
Pt called for covid results advised results not back. 

## 2019-04-08 ENCOUNTER — Telehealth: Payer: Self-pay | Admitting: Unknown Physician Specialty

## 2019-04-08 NOTE — Telephone Encounter (Signed)
Discussed with patient about Covid symptoms and the use of bamlanivimab, a monoclonal antibody infusion for those with mild to moderate Covid symptoms and at a high risk of hospitalization.  Pt is qualified for this infusion at the [facility] infusion center due to Diabetes which were addressed with the patient and are actively being managed by a [facility] provider.   Symptoms for 3 days with mild symptoms. Will call back tomorrow.  Willing to take the infusion if worsening symptoms.   Will call back tomorrow

## 2019-04-09 ENCOUNTER — Other Ambulatory Visit: Payer: Self-pay | Admitting: Unknown Physician Specialty

## 2019-04-09 ENCOUNTER — Telehealth: Payer: Self-pay

## 2019-04-09 ENCOUNTER — Telehealth: Payer: Self-pay | Admitting: Unknown Physician Specialty

## 2019-04-09 DIAGNOSIS — U071 COVID-19: Secondary | ICD-10-CM

## 2019-04-09 NOTE — Telephone Encounter (Signed)
Patient needs result faxed to Liz Malady at [facility], [place] [phone]. Request will be sent to complete

## 2019-04-09 NOTE — Telephone Encounter (Signed)
Per pt's request, faxed COVID results to [facility]; attn: Liz Malady @ [phone].

## 2019-04-09 NOTE — Telephone Encounter (Signed)
  I connected by phone with George Bush on 04/09/2019 at 10:16 AM to discuss the potential use of an new treatment for mild to moderate COVID-19 viral infection in non-hospitalized patients.  This patient is a 53 y.o. male that meets the FDA criteria for Emergency Use Authorization of bamlanivimab or casirivimab\imdevimab.  Has a (+) direct SARS-CoV-2 viral test result  Has mild or moderate COVID-19   Is ? 54 years of age and weighs ? 40 kg  Is NOT hospitalized due to COVID-19  Is NOT requiring oxygen therapy or requiring an increase in baseline oxygen flow rate due to COVID-19  Is within 10 days of symptom onset  Has at least one of the high risk factor(s) for progression to severe COVID-19 and/or hospitalization as defined in EUA.  Specific high risk criteria : Diabetes   I have spoken and communicated the following to the patient or parent/caregiver:  1. FDA has authorized the emergency use of bamlanivimab and casirivimab\imdevimab for the treatment of mild to moderate COVID-19 in adults and pediatric patients with positive results of direct SARS-CoV-2 viral testing who are 67 years of age and older weighing at least 40 kg, and who are at high risk for progressing to severe COVID-19 and/or hospitalization.  2. The significant known and potential risks and benefits of bamlanivimab and casirivimab\imdevimab, and the extent to which such potential risks and benefits are unknown.  3. Information on available alternative treatments and the risks and benefits of those alternatives, including clinical trials.  4. Patients treated with bamlanivimab and casirivimab\imdevimab should continue to self-isolate and use infection control measures (e.g., wear mask, isolate, social distance, avoid sharing personal items, clean and disinfect "high touch" surfaces, and frequent handwashing) according to CDC guidelines.   5. The patient or parent/caregiver has the option to accept or refuse  bamlanivimab or casirivimab\imdevimab .  After reviewing this information with the patient, The patient agreed to proceed with receiving the bamlanimivab infusion and will be provided a copy of the Fact sheet prior to receiving the infusion.Kathrine Haddock 04/09/2019 10:16 AM

## 2019-04-11 ENCOUNTER — Encounter (HOSPITAL_COMMUNITY): Payer: Self-pay

## 2019-04-11 ENCOUNTER — Ambulatory Visit (HOSPITAL_COMMUNITY)
Admission: RE | Admit: 2019-04-11 | Discharge: 2019-04-11 | Disposition: A | Discharge status: Home / Self Care | Payer: BC Managed Care – PPO | Source: Ambulatory Visit | Attending: Pulmonary Disease | Admitting: Pulmonary Disease

## 2019-04-11 DIAGNOSIS — U071 COVID-19: Secondary | ICD-10-CM

## 2019-04-11 MED ORDER — DIPHENHYDRAMINE HCL 50 MG/ML IJ SOLN: 50.0000 mg | Freq: Once | INTRAMUSCULAR | Status: DC | PRN

## 2019-04-11 MED ORDER — EPINEPHRINE 0.3 MG/0.3ML IJ SOAJ: 0.3000 mg | Freq: Once | INTRAMUSCULAR | Status: DC | PRN

## 2019-04-11 MED ORDER — FAMOTIDINE IN NACL 20-0.9 MG/50ML-% IV SOLN: 20.0000 mg | Freq: Once | INTRAVENOUS | Status: DC | PRN

## 2019-04-11 MED ORDER — ALBUTEROL SULFATE HFA 108 (90 BASE) MCG/ACT IN AERS: 2.0000 | INHALATION_SPRAY | Freq: Once | RESPIRATORY_TRACT | Status: DC | PRN

## 2019-04-11 MED ORDER — SODIUM CHLORIDE 0.9 % IV SOLN
700.0000 mg | Freq: Once | INTRAVENOUS | Status: AC
  Administered 2019-04-11: 12:00:00 700 mg via INTRAVENOUS
  Filled 2019-04-11: qty 20

## 2019-04-11 MED ORDER — SODIUM CHLORIDE 0.9 % IV SOLN
INTRAVENOUS | Status: DC | PRN
  Administered 2019-04-11: 250 mL via INTRAVENOUS

## 2019-04-11 MED ORDER — METHYLPREDNISOLONE SODIUM SUCC 125 MG IJ SOLR: 125.0000 mg | Freq: Once | INTRAMUSCULAR | Status: DC | PRN

## 2019-04-11 NOTE — Discharge Instructions (Signed)
Prevent the Spread of COVID-19 if You Are Sick If you are sick with COVID-19 or think you might have COVID-19, follow the steps below to help protect other people in your home and community. Stay home except to get medical care.  Stay home. Most people with COVID-19 have mild illness and are able to recover at home without medical care. Do not leave your home, except to get medical care. Do not visit public areas.  Take care of yourself. Get rest and stay hydrated.  Get medical care when needed. Call your doctor before you go to their office for care. But, if you have trouble breathing or other concerning symptoms, call 911 for immediate help.  Avoid public transportation, ride-sharing, or taxis. Separate yourself from other people and pets in your home.  As much as possible, stay in a specific room and away from other people and pets in your home. Also, you should use a separate bathroom, if available. If you need to be around other people or animals in or outside of the home, wear a cloth face covering. ? See COVID-19 and Animals if you have questions about pets: https://www.cdc.gov/coronavirus/2019-ncov/faq.html#COVID19animals Monitor your symptoms.  Common symptoms of COVID-19 include fever and cough. Trouble breathing is a more serious symptom that means you should get medical attention.  Follow care instructions from your healthcare provider and local health department. Your local health authorities will give instructions on checking your symptoms and reporting information. If you develop emergency warning signs for COVID-19 get medical attention immediately.  Emergency warning signs include*:  Trouble breathing  Persistent pain or pressure in the chest  New confusion or not able to be woken  Bluish lips or face *This list is not all inclusive. Please consult your medical provider for any other symptoms that are severe or concerning to you. Call 911 if you have a medical  emergency. If you have a medical emergency and need to call 911, notify the operator that you have or think you might have, COVID-19. If possible, put on a facemask before medical help arrives. Call ahead before visiting your doctor.  Call ahead. Many medical visits for routine care are being postponed or done by phone or telemedicine.  If you have a medical appointment that cannot be postponed, call your doctor's office. This will help the office protect themselves and other patients. If you are sick, wear a cloth covering over your nose and mouth.  You should wear a cloth face covering over your nose and mouth if you must be around other people or animals, including pets (even at home).  You don't need to wear the cloth face covering if you are alone. If you can't put on a cloth face covering (because of trouble breathing for example), cover your coughs and sneezes in some other way. Try to stay at least 6 feet away from other people. This will help protect the people around you. Note: During the COVID-19 pandemic, medical grade facemasks are reserved for healthcare workers and some first responders. You may need to make a cloth face covering using a scarf or bandana. Cover your coughs and sneezes.  Cover your mouth and nose with a tissue when you cough or sneeze.  Throw used tissues in a lined trash can.  Immediately wash your hands with soap and water for at least 20 seconds. If soap and water are not available, clean your hands with an alcohol-based hand sanitizer that contains at least 60% alcohol. Clean your hands often.    Wash your hands often with soap and water for at least 20 seconds. This is especially important after blowing your nose, coughing, or sneezing; going to the bathroom; and before eating or preparing food.  Use hand sanitizer if soap and water are not available. Use an alcohol-based hand sanitizer with at least 60% alcohol, covering all surfaces of your hands and rubbing  them together until they feel dry.  Soap and water are the best option, especially if your hands are visibly dirty.  Avoid touching your eyes, nose, and mouth with unwashed hands. Avoid sharing personal household items.  Do not share dishes, drinking glasses, cups, eating utensils, towels, or bedding with other people in your home.  Wash these items thoroughly after using them with soap and water or put them in the dishwasher. Clean all "high-touch" surfaces everyday.  Clean and disinfect high-touch surfaces in your "sick room" and bathroom. Let someone else clean and disinfect surfaces in common areas, but not your bedroom and bathroom.  If a caregiver or other person needs to clean and disinfect a sick person's bedroom or bathroom, they should do so on an as-needed basis. The caregiver/other person should wear a mask and wait as long as possible after the sick person has used the bathroom. High-touch surfaces include phones, remote controls, counters, tabletops, doorknobs, bathroom fixtures, toilets, keyboards, tablets, and bedside tables.  Clean and disinfect areas that may have blood, stool, or body fluids on them.  Use household cleaners and disinfectants. Clean the area or item with soap and water or another detergent if it is dirty. Then use a household disinfectant. ? Be sure to follow the instructions on the label to ensure safe and effective use of the product. Many products recommend keeping the surface wet for several minutes to ensure germs are killed. Many also recommend precautions such as wearing gloves and making sure you have good ventilation during use of the product. ? Most EPA-registered household disinfectants should be effective. How to discontinue home isolation  People with COVID-19 who have stayed home (home isolated) can stop home isolation under the following conditions: ? If you will not have a test to determine if you are still contagious, you can leave home  after these three things have happened:  You have had no fever for at least 72 hours (that is three full days of no fever without the use of medicine that reduces fevers) AND  other symptoms have improved (for example, when your cough or shortness of breath has improved) AND  at least 10 days have passed since your symptoms first appeared. ? If you will be tested to determine if you are still contagious, you can leave home after these three things have happened:  You no longer have a fever (without the use of medicine that reduces fevers) AND  other symptoms have improved (for example, when your cough or shortness of breath has improved) AND  you received two negative tests in a row, 24 hours apart. Your doctor will follow CDC guidelines. In all cases, follow the guidance of your healthcare provider and local health department. The decision to stop home isolation should be made in consultation with your healthcare provider and state and local health departments. Local decisions depend on local circumstances. cdc.gov/coronavirus 08/27/2018 This information is not intended to replace advice given to you by your health care provider. Make sure you discuss any questions you have with your health care provider. Document Released: 08/08/2018 Document Revised: 09/06/2018 Document Reviewed: 08/08/2018   Elsevier Patient Education  2020 [employer]  

## 2019-04-11 NOTE — Progress Notes (Deleted)
  Diagnosis: COVID-19  Physician:Walsh  Procedure: [facility] Med: bamlanivimab infusion - Provided patient with bamlanimivab fact sheet for patients, parents and caregivers prior to infusion.  Complications: No immediate complications noted.  Discharge: Discharged home   Baxter Hire 04/11/2019

## 2019-04-11 NOTE — Progress Notes (Signed)
  Diagnosis: COVID-19  Physician: Dr. Volanda Napoleon  Procedure: [facility] Infusion Clinic Med: bamlanivimab infusion - Provided patient with bamlanimivab fact sheet for patients, parents and caregivers prior to infusion.  Complications: No immediate complications noted.  Discharge: Discharged home   George Bush N [name] 04/11/2019

## 2019-04-12 ENCOUNTER — Ambulatory Visit: Payer: BC Managed Care – PPO

## 2019-04-26 ENCOUNTER — Other Ambulatory Visit: Payer: Self-pay | Admitting: Urology

## 2019-04-26 DIAGNOSIS — N5201 Erectile dysfunction due to arterial insufficiency: Secondary | ICD-10-CM

## 2019-04-30 ENCOUNTER — Ambulatory Visit: Payer: BC Managed Care – PPO | Admitting: Urology

## 2019-05-02 ENCOUNTER — Ambulatory Visit (INDEPENDENT_AMBULATORY_CARE_PROVIDER_SITE_OTHER): Payer: BC Managed Care – PPO | Admitting: Urology

## 2019-05-02 ENCOUNTER — Other Ambulatory Visit: Payer: Self-pay

## 2019-05-02 ENCOUNTER — Encounter: Payer: Self-pay | Admitting: Urology

## 2019-05-02 VITALS — BP 133/87 | HR 91 | Ht 70.5 in | Wt 257.7 lb

## 2019-05-02 DIAGNOSIS — N5201 Erectile dysfunction due to arterial insufficiency: Secondary | ICD-10-CM

## 2019-05-02 DIAGNOSIS — E291 Testicular hypofunction: Secondary | ICD-10-CM | POA: Diagnosis not present

## 2019-05-02 MED ORDER — TESTOSTERONE CYPIONATE 200 MG/ML IM SOLN
200.0000 mg | Freq: Once | INTRAMUSCULAR | Status: AC
  Administered 2019-05-02: 200 mg via INTRAMUSCULAR

## 2019-05-02 MED ORDER — TADALAFIL 20 MG PO TABS: 20.0000 mg | ORAL_TABLET | Freq: Every day | ORAL | 0 refills | Status: DC | PRN

## 2019-05-02 NOTE — Progress Notes (Signed)
Testosterone IM Injection  Due to Hypogonadism patient is present today for a Testosterone Injection.  Medication: Testosterone Cypionate Dose: 106mL Location: right upper outer buttocks Lot: [id number] Exp:02/2020  Patient tolerated well, no complications were noted  Preformed by: Debbe Bales, CMA   Follow up: RTC as scheduled

## 2019-05-02 NOTE — Progress Notes (Signed)
05/02/2019 10:29 AM   George Bush Jan 04, 1966 [id number]  Referring provider: Aldean Jewett, MD [place],  [place] [address]  Chief Complaint  Patient presents with  . Hypogonadism    HPI: 54 y.o. male followed for hypogonadism and ED.  He receives his injections every 2 weeks in office.  Since his last visit he denies bothersome lower urinary tract symptoms, lower extremity edema or breast tenderness/enlargement.  He is using sildenafil for ED but wanted to try generic tadalafil.   PMH: Past Medical History:  Diagnosis Date  . HIV (human immunodeficiency virus infection) [facility])     Surgical History: Past Surgical History:  Procedure Laterality Date  . DISTAL BICEPS TENDON REPAIR Right 03/20/2019   Procedure: DISTAL BICEPS TENDON REPAIR;  Surgeon: Leim Fabry, MD;  Location: [facility] ORS;  Service: Orthopedics;  Laterality: Right;  . SKIN SURGERY      Home Medications:  Allergies as of 05/02/2019   No Known Allergies     Medication List       Accurate as of May 02, 2019 10:29 AM. If you have any questions, ask your nurse or doctor.        STOP taking these medications   acetaminophen 500 MG tablet Commonly known as: TYLENOL Stopped by: Abbie Sons, MD   ondansetron 4 MG disintegrating tablet Commonly known as: Zofran ODT Stopped by: Abbie Sons, MD   oxyCODONE 5 MG immediate release tablet Commonly known as: Roxicodone Stopped by: Abbie Sons, MD     TAKE these medications   atorvastatin 40 MG tablet Commonly known as: LIPITOR Take 40 mg by mouth daily.   elvitegravir-cobicistat-emtricitabine-tenofovir 150-150-200-10 MG Tabs tablet Commonly known as: GENVOYA Take 1 tablet by mouth daily.   lisinopril-hydrochlorothiazide 20-25 MG tablet Commonly known as: ZESTORETIC Take 1 tablet by mouth daily.   metFORMIN 1000 MG tablet Commonly known as: GLUCOPHAGE Take 1,000 mg by mouth 2 (two) times  daily.   Ozempic (1 MG/DOSE) 2 MG/1.5ML Sopn Generic drug: Semaglutide (1 MG/DOSE) Inject 1 mg into the skin every Thursday.   QC Aspirin 325 MG tablet Generic drug: aspirin Take 325 mg by mouth daily.   sildenafil 20 MG tablet Commonly known as: REVATIO TAKE 2-5 TABLETS BY MOUTH AS NEEDED FOR ERECTILE DYSFUNCTION   testosterone cypionate 200 MG/ML injection Commonly known as: DEPOTESTOSTERONE CYPIONATE Inject 1 mL (200 mg total) into the muscle every 14 (fourteen) days.   Tyler Aas FlexTouch 100 UNIT/ML Sopn FlexTouch Pen Generic drug: insulin degludec SMARTSIG:0.5 Milliliter(s) SUB-Q Daily   vitamin B-12 500 MCG tablet Commonly known as: CYANOCOBALAMIN Take 1,000 mcg by mouth daily.       Allergies: No Known Allergies  Family History: No family history on file.  Social History:  reports that he has never smoked. He has never used smokeless tobacco. He reports that he does not drink alcohol or use drugs.  ROS: UROLOGY Frequent Urination?: No Hard to postpone urination?: No Burning/pain with urination?: No Get up at night to urinate?: No Leakage of urine?: No Urine stream starts and stops?: No Trouble starting stream?: No Do you have to strain to urinate?: No Blood in urine?: No Urinary tract infection?: No Sexually transmitted disease?: No Injury to kidneys or bladder?: No Painful intercourse?: No Weak stream?: No Erection problems?: Yes Penile pain?: No  Gastrointestinal Nausea?: No Vomiting?: No Indigestion/heartburn?: No Diarrhea?: No Constipation?: No  Constitutional Fever: No Night sweats?: No Weight loss?: No Fatigue?:  No  Skin Skin rash/lesions?: No Itching?: No  Eyes Blurred vision?: No Double vision?: No  Ears/Nose/Throat Sore throat?: No Sinus problems?: No  Hematologic/Lymphatic Swollen glands?: No Easy bruising?: No  Cardiovascular Leg swelling?: No Chest pain?: No  Respiratory Cough?: No Shortness of breath?:  No  Endocrine Excessive thirst?: No  Musculoskeletal Back pain?: No Joint pain?: No  Neurological Headaches?: No Dizziness?: No  Psychologic Depression?: No Anxiety?: No  Physical Exam: BP 133/87 (BP Location: Left Arm, Patient Position: Sitting, Cuff Size: Normal)   Pulse 91   Ht 5' 10.5" (1.791 m)   Wt 257 lb 11.2 oz (116.9 kg)   BMI 36.45 kg/m   Constitutional:  Alert and oriented, No acute distress. HEENT: [place] AT, moist mucus membranes.  Trachea midline, no masses. Cardiovascular: No clubbing, cyanosis, or edema. Respiratory: Normal respiratory effort, no increased work of breathing. GI: Abdomen is soft, nontender, nondistended, no abdominal masses GU: Prostate 30 g, smooth without nodules Lymph: No cervical or inguinal lymphadenopathy. Skin: No rashes, bruises or suspicious lesions. Neurologic: Grossly intact, no focal deficits, moving all 4 extremities. Psychiatric: Normal mood and affect.   Assessment & Plan:    - Hypogonadism in male He received an injection today and monitoring labs were drawn including PSA, testosterone, hematocrit.  Follow-up lab visit for testosterone/hematocrit in 6 months and office visit/DRE 1 year.  - Erectile dysfunction due to arterial insufficiency Rx tadalafil sent to pharmacy.    Riki Altes, MD  [facility] [address] [place], [place] [address] [phone]

## 2019-05-03 ENCOUNTER — Ambulatory Visit: Payer: BC Managed Care – PPO

## 2019-05-03 LAB — HEMATOCRIT: Hematocrit: 41.8 % (ref 37.5–51.0)

## 2019-05-03 LAB — TESTOSTERONE: Testosterone: 169 ng/dL — ABNORMAL LOW (ref 264–916)

## 2019-05-03 LAB — PSA: Prostate Specific Ag, Serum: 0.5 ng/mL (ref 0.0–4.0)

## 2019-05-04 ENCOUNTER — Telehealth: Payer: Self-pay

## 2019-05-04 ENCOUNTER — Encounter: Payer: Self-pay | Admitting: Urology

## 2019-05-04 NOTE — Telephone Encounter (Signed)
-----   Message from Riki Altes, MD sent at 05/03/2019  1:45 PM EST ----- Testosterone level was low at 169 however this was a trough level.  PSA stable at 0.5 and hematocrit was normal.  Follow-up as scheduled.

## 2019-05-04 NOTE — Telephone Encounter (Signed)
Called pt informed him of the information below. Pt gave verbal understanding.  

## 2019-05-16 ENCOUNTER — Ambulatory Visit (INDEPENDENT_AMBULATORY_CARE_PROVIDER_SITE_OTHER): Payer: BC Managed Care – PPO | Admitting: Family Medicine

## 2019-05-16 ENCOUNTER — Other Ambulatory Visit: Payer: Self-pay

## 2019-05-16 DIAGNOSIS — E291 Testicular hypofunction: Secondary | ICD-10-CM | POA: Diagnosis not present

## 2019-05-16 MED ORDER — TESTOSTERONE CYPIONATE 200 MG/ML IM SOLN
200.0000 mg | Freq: Once | INTRAMUSCULAR | Status: AC
  Administered 2019-05-16: 200 mg via INTRAMUSCULAR

## 2019-05-16 NOTE — Progress Notes (Signed)
Testosterone IM Injection  Due to Hypogonadism patient is present today for a Testosterone Injection.  Medication: Testosterone Cypionate Dose: Location: left upper outer buttocks Lot: [id number] Exp:08/2021  Patient tolerated well, no complications were noted  Preformed by: Teressa Lower, CMA  Follow up: 2 weeks

## 2019-05-30 ENCOUNTER — Other Ambulatory Visit: Payer: Self-pay

## 2019-05-30 ENCOUNTER — Ambulatory Visit (INDEPENDENT_AMBULATORY_CARE_PROVIDER_SITE_OTHER): Payer: BC Managed Care – PPO | Admitting: Family Medicine

## 2019-05-30 DIAGNOSIS — E291 Testicular hypofunction: Secondary | ICD-10-CM

## 2019-05-30 MED ORDER — TESTOSTERONE CYPIONATE 200 MG/ML IM SOLN
200.0000 mg | Freq: Once | INTRAMUSCULAR | Status: AC
  Administered 2019-05-30: 200 mg via INTRAMUSCULAR

## 2019-05-30 NOTE — Progress Notes (Signed)
Testosterone IM Injection  Due to Hypogonadism patient is present today for a Testosterone Injection.  Medication: Testosterone Cypionate Dose: 42ml Location: right upper outer buttocks Lot: [id number] Exp:08/2021  Patient tolerated well, no complications were noted  Preformed by: Teressa Lower, CMA  Follow up: 2 weeks

## 2019-06-13 ENCOUNTER — Other Ambulatory Visit: Payer: Self-pay

## 2019-06-13 ENCOUNTER — Ambulatory Visit (INDEPENDENT_AMBULATORY_CARE_PROVIDER_SITE_OTHER): Payer: BC Managed Care – PPO

## 2019-06-13 DIAGNOSIS — E291 Testicular hypofunction: Secondary | ICD-10-CM | POA: Diagnosis not present

## 2019-06-13 MED ORDER — TESTOSTERONE CYPIONATE 200 MG/ML IM SOLN
200.0000 mg | Freq: Once | INTRAMUSCULAR | Status: AC
  Administered 2019-06-13: 200 mg via INTRAMUSCULAR

## 2019-06-13 NOTE — Progress Notes (Signed)
Testosterone IM Injection  Due to Hypogonadism patient is present today for a Testosterone Injection.  Medication: Testosterone Cypionate Dose: 15ml Location: right upper outer buttocks Lot: [id number] Exp:08/2021  Patient tolerated well, no complications were noted  Preformed by: Franchot Erichsen, CMA   Follow up: 2 weeks

## 2019-06-27 ENCOUNTER — Ambulatory Visit (INDEPENDENT_AMBULATORY_CARE_PROVIDER_SITE_OTHER): Payer: BC Managed Care – PPO | Admitting: Family Medicine

## 2019-06-27 ENCOUNTER — Other Ambulatory Visit: Payer: Self-pay

## 2019-06-27 DIAGNOSIS — E291 Testicular hypofunction: Secondary | ICD-10-CM

## 2019-06-27 MED ORDER — TESTOSTERONE CYPIONATE 200 MG/ML IM SOLN
200.0000 mg | Freq: Once | INTRAMUSCULAR | Status: AC
  Administered 2019-06-27: 200 mg via INTRAMUSCULAR

## 2019-06-27 NOTE — Progress Notes (Signed)
Testosterone IM Injection  Due to Hypogonadism patient is present today for a Testosterone Injection.  Medication: Testosterone Cypionate Dose: 95ml Location: left upper outer buttocks Lot: [id number] Exp:08/24/2021  Patient tolerated well, no complications were noted  Preformed by: Teressa Lower, CMA  Follow up: 2 weeks

## 2019-07-11 ENCOUNTER — Other Ambulatory Visit: Payer: Self-pay

## 2019-07-11 ENCOUNTER — Ambulatory Visit (INDEPENDENT_AMBULATORY_CARE_PROVIDER_SITE_OTHER): Payer: BC Managed Care – PPO

## 2019-07-11 DIAGNOSIS — E291 Testicular hypofunction: Secondary | ICD-10-CM

## 2019-07-11 MED ORDER — TESTOSTERONE CYPIONATE 200 MG/ML IM SOLN
200.0000 mg | Freq: Once | INTRAMUSCULAR | Status: AC
  Administered 2019-07-11: 200 mg via INTRAMUSCULAR

## 2019-07-11 NOTE — Progress Notes (Signed)
Testosterone IM Injection  Due to Hypogonadism patient is present today for a Testosterone Injection.  Medication: Testosterone Cypionate Dose: 27ml Location: right upper outer buttocks Lot: [id number] Exp:09/22/2021  Patient tolerated well, no complications were noted  Preformed by: Eligha Bridegroom, CMA  Follow up: 2wk injection, patient was notified that this was his last vial of medication and will need to bring more to his next visit

## 2019-07-16 ENCOUNTER — Ambulatory Visit: Payer: Self-pay

## 2019-07-25 ENCOUNTER — Ambulatory Visit: Payer: Self-pay

## 2019-07-25 ENCOUNTER — Other Ambulatory Visit: Payer: Self-pay | Admitting: Urology

## 2019-07-25 DIAGNOSIS — E291 Testicular hypofunction: Secondary | ICD-10-CM

## 2019-07-31 ENCOUNTER — Other Ambulatory Visit: Payer: Self-pay

## 2019-07-31 ENCOUNTER — Ambulatory Visit (INDEPENDENT_AMBULATORY_CARE_PROVIDER_SITE_OTHER): Payer: BC Managed Care – PPO

## 2019-07-31 DIAGNOSIS — E291 Testicular hypofunction: Secondary | ICD-10-CM

## 2019-07-31 MED ORDER — TESTOSTERONE CYPIONATE 200 MG/ML IM SOLN
200.0000 mg | Freq: Once | INTRAMUSCULAR | Status: AC
  Administered 2019-07-31: 200 mg via INTRAMUSCULAR

## 2019-07-31 NOTE — Progress Notes (Signed)
Testosterone IM Injection  Due to Hypogonadism patient is present today for a Testosterone Injection.  Medication: Testosterone Cypionate Dose: 43mL Location: left upper outer buttocks Lot: [id number] Exp:09/2021  Patient tolerated well, no complications were noted  Preformed by: Franchot Erichsen, CMA  Follow up: 2 weeks

## 2019-08-14 ENCOUNTER — Ambulatory Visit: Payer: Self-pay

## 2019-08-15 ENCOUNTER — Ambulatory Visit: Payer: Self-pay

## 2019-08-15 ENCOUNTER — Ambulatory Visit (INDEPENDENT_AMBULATORY_CARE_PROVIDER_SITE_OTHER): Payer: BC Managed Care – PPO

## 2019-08-15 ENCOUNTER — Other Ambulatory Visit: Payer: Self-pay

## 2019-08-15 DIAGNOSIS — E291 Testicular hypofunction: Secondary | ICD-10-CM | POA: Diagnosis not present

## 2019-08-15 MED ORDER — TESTOSTERONE CYPIONATE 200 MG/ML IM SOLN
200.0000 mg | Freq: Once | INTRAMUSCULAR | Status: AC
  Administered 2019-08-15: 200 mg via INTRAMUSCULAR

## 2019-08-15 NOTE — Progress Notes (Signed)
Testosterone IM Injection  Due to Hypogonadism patient is present today for a Testosterone Injection.  Medication: Testosterone Cypionate Dose: 58ml Location: right upper outer buttocks Lot: [id number] Exp:09/2021  Patient tolerated well, no complications were noted  Preformed by: Livingston Diones  Follow up: 2wk

## 2019-08-29 ENCOUNTER — Ambulatory Visit (INDEPENDENT_AMBULATORY_CARE_PROVIDER_SITE_OTHER): Payer: BC Managed Care – PPO

## 2019-08-29 ENCOUNTER — Other Ambulatory Visit: Payer: Self-pay

## 2019-08-29 DIAGNOSIS — E291 Testicular hypofunction: Secondary | ICD-10-CM | POA: Diagnosis not present

## 2019-08-29 MED ORDER — TESTOSTERONE CYPIONATE 200 MG/ML IM SOLN
200.0000 mg | Freq: Once | INTRAMUSCULAR | Status: AC
  Administered 2019-08-29: 200 mg via INTRAMUSCULAR

## 2019-08-29 NOTE — Progress Notes (Signed)
Testosterone IM Injection  Due to Hypogonadism patient is present today for a Testosterone Injection.  Medication: Testosterone Cypionate Dose: 200mg /34ml Location: left upper outer buttocks Lot: [id number] Exp:06/23  Patient tolerated well, no complications were noted  Preformed by: 05-07-2004, CMA  Follow up: 2wk injection

## 2019-09-12 ENCOUNTER — Ambulatory Visit (INDEPENDENT_AMBULATORY_CARE_PROVIDER_SITE_OTHER): Payer: BC Managed Care – PPO

## 2019-09-12 ENCOUNTER — Other Ambulatory Visit: Payer: Self-pay | Admitting: Urology

## 2019-09-12 ENCOUNTER — Other Ambulatory Visit: Payer: Self-pay

## 2019-09-12 DIAGNOSIS — N5201 Erectile dysfunction due to arterial insufficiency: Secondary | ICD-10-CM

## 2019-09-12 DIAGNOSIS — E291 Testicular hypofunction: Secondary | ICD-10-CM | POA: Diagnosis not present

## 2019-09-12 MED ORDER — TESTOSTERONE CYPIONATE 200 MG/ML IM SOLN
200.0000 mg | Freq: Once | INTRAMUSCULAR | Status: AC
  Administered 2019-09-12: 200 mg via INTRAMUSCULAR

## 2019-09-12 NOTE — Progress Notes (Signed)
Testosterone IM Injection  Due to Hypogonadism patient is present today for a Testosterone Injection.  Medication: Testosterone Cypionate Dose: 51ml Location: right upper outer buttocks Lot: [id number] Exp:06/20236  Patient tolerated well, no complications were noted  Preformed by: Eligha Bridegroom, CMA  Follow up: 2wk injection

## 2019-09-26 ENCOUNTER — Ambulatory Visit (INDEPENDENT_AMBULATORY_CARE_PROVIDER_SITE_OTHER): Payer: BC Managed Care – PPO

## 2019-09-26 ENCOUNTER — Other Ambulatory Visit: Payer: Self-pay

## 2019-09-26 DIAGNOSIS — E291 Testicular hypofunction: Secondary | ICD-10-CM | POA: Diagnosis not present

## 2019-09-26 MED ORDER — TESTOSTERONE CYPIONATE 200 MG/ML IM SOLN
200.0000 mg | Freq: Once | INTRAMUSCULAR | Status: AC
  Administered 2019-09-26: 200 mg via INTRAMUSCULAR

## 2019-09-26 NOTE — Progress Notes (Signed)
Testosterone IM Injection  Due to Hypogonadism patient is present today for a Testosterone Injection.  Medication: Testosterone Cypionate Dose: 67ml Location: left upper outer buttocks Lot: [id number] Exp:10/23/2021  Patient tolerated well, no complications were noted  Preformed by: Eligha Bridegroom, CMA  Follow up: 2wk injection

## 2019-10-10 ENCOUNTER — Other Ambulatory Visit: Payer: Self-pay

## 2019-10-10 ENCOUNTER — Ambulatory Visit (INDEPENDENT_AMBULATORY_CARE_PROVIDER_SITE_OTHER): Payer: BC Managed Care – PPO

## 2019-10-10 DIAGNOSIS — E291 Testicular hypofunction: Secondary | ICD-10-CM | POA: Diagnosis not present

## 2019-10-10 MED ORDER — TESTOSTERONE CYPIONATE 200 MG/ML IM SOLN
200.0000 mg | Freq: Once | INTRAMUSCULAR | Status: AC
  Administered 2019-10-10: 200 mg via INTRAMUSCULAR

## 2019-10-10 NOTE — Progress Notes (Signed)
Testosterone IM Injection  Due to Hypogonadism patient is present today for a Testosterone Injection.  Medication: Testosterone Cypionate Dose: 57mL Location: right upper outer buttocks Lot: [id number] Exp:09/2021  Patient tolerated well, no complications were noted  Performed by: Franchot Erichsen CMA  Follow up: RTC in 2 wks for next inj.

## 2019-10-24 ENCOUNTER — Ambulatory Visit (INDEPENDENT_AMBULATORY_CARE_PROVIDER_SITE_OTHER): Payer: BC Managed Care – PPO | Admitting: *Deleted

## 2019-10-24 ENCOUNTER — Other Ambulatory Visit: Payer: Self-pay

## 2019-10-24 DIAGNOSIS — E291 Testicular hypofunction: Secondary | ICD-10-CM

## 2019-10-24 MED ORDER — TESTOSTERONE CYPIONATE 200 MG/ML IM SOLN
200.0000 mg | Freq: Once | INTRAMUSCULAR | Status: AC
  Administered 2019-10-24: 200 mg via INTRAMUSCULAR

## 2019-10-24 NOTE — Progress Notes (Addendum)
Testosterone IM Injection  Due to Hypogonadism patient is present today for a Testosterone Injection.  Medication: Testosterone Cypionate Dose: 6ml Location: left upper outer buttocks Lot: [id number]  Exp:06/23  Patient tolerated well, no complications were noted  Performed by: Milas Kocher, CMA  Follow up: 2 weeks 11/07/19

## 2019-11-01 ENCOUNTER — Other Ambulatory Visit: Payer: Self-pay

## 2019-11-02 ENCOUNTER — Other Ambulatory Visit: Payer: BC Managed Care – PPO

## 2019-11-02 ENCOUNTER — Other Ambulatory Visit: Payer: Self-pay

## 2019-11-02 DIAGNOSIS — E291 Testicular hypofunction: Secondary | ICD-10-CM

## 2019-11-03 LAB — HEMATOCRIT: Hematocrit: 42.7 % (ref 37.5–51.0)

## 2019-11-03 LAB — TESTOSTERONE: Testosterone: 372 ng/dL (ref 264–916)

## 2019-11-05 ENCOUNTER — Telehealth: Payer: Self-pay | Admitting: *Deleted

## 2019-11-05 NOTE — Telephone Encounter (Signed)
-----   Message from Riki Altes, MD sent at 11/05/2019  7:56 AM EDT ----- Testosterone level in normal range at 372.  Hematocrit was normal.  Follow-up as scheduled

## 2019-11-05 NOTE — Telephone Encounter (Signed)
Notified patient as instructed, patient pleased. Discussed follow-up appointments, patient agrees  

## 2019-11-07 ENCOUNTER — Other Ambulatory Visit: Payer: Self-pay

## 2019-11-07 ENCOUNTER — Ambulatory Visit (INDEPENDENT_AMBULATORY_CARE_PROVIDER_SITE_OTHER): Payer: BC Managed Care – PPO

## 2019-11-07 DIAGNOSIS — E291 Testicular hypofunction: Secondary | ICD-10-CM | POA: Diagnosis not present

## 2019-11-07 MED ORDER — TESTOSTERONE CYPIONATE 200 MG/ML IM SOLN
200.0000 mg | Freq: Once | INTRAMUSCULAR | Status: AC
  Administered 2019-11-07: 200 mg via INTRAMUSCULAR

## 2019-11-07 NOTE — Progress Notes (Signed)
Testosterone IM Injection  Due to Hypogonadism patient is present today for a Testosterone Injection.  Medication: Testosterone Cypionate Dose: 23ml Location: right upper outer buttocks Lot: [id number] Exp:09/2021  Patient tolerated well, no complications were noted  Preformed by: Eligha Bridegroom, CMA  Follow up: 2wks

## 2019-11-12 ENCOUNTER — Ambulatory Visit: Payer: Self-pay

## 2019-11-21 ENCOUNTER — Ambulatory Visit (INDEPENDENT_AMBULATORY_CARE_PROVIDER_SITE_OTHER): Payer: BC Managed Care – PPO

## 2019-11-21 DIAGNOSIS — E291 Testicular hypofunction: Secondary | ICD-10-CM

## 2019-11-21 MED ORDER — TESTOSTERONE CYPIONATE 200 MG/ML IM SOLN
200.0000 mg | Freq: Once | INTRAMUSCULAR | Status: AC
  Administered 2019-11-21: 200 mg via INTRAMUSCULAR

## 2019-11-21 NOTE — Progress Notes (Signed)
Testosterone IM Injection  Due to Hypogonadism patient is present today for a Testosterone Injection.  Medication: Testosterone Cypionate Dose: 53ml Location: left upper outer buttocks Lot: [id number] Exp:09/2021  Patient tolerated well, no complications were noted  Preformed by: Eligha Bridegroom, CMA  Follow up: 2wk, patient was instructed to refill medication and bring new vial to next appointment

## 2019-11-24 ENCOUNTER — Other Ambulatory Visit: Payer: Self-pay | Admitting: Urology

## 2019-11-24 DIAGNOSIS — N5201 Erectile dysfunction due to arterial insufficiency: Secondary | ICD-10-CM

## 2019-11-25 ENCOUNTER — Other Ambulatory Visit: Payer: Self-pay | Admitting: Urology

## 2019-11-27 ENCOUNTER — Other Ambulatory Visit: Payer: Self-pay | Admitting: *Deleted

## 2019-11-27 ENCOUNTER — Telehealth: Payer: Self-pay | Admitting: Urology

## 2019-11-27 DIAGNOSIS — N5201 Erectile dysfunction due to arterial insufficiency: Secondary | ICD-10-CM

## 2019-11-27 MED ORDER — TADALAFIL 20 MG PO TABS: 20.0000 mg | ORAL_TABLET | Freq: Every day | ORAL | 0 refills | Status: DC | PRN

## 2019-11-27 NOTE — Telephone Encounter (Signed)
Patient requesting a refill of Cialis to be sent to [facility].

## 2019-12-05 ENCOUNTER — Ambulatory Visit: Payer: Self-pay

## 2019-12-07 ENCOUNTER — Other Ambulatory Visit: Payer: Self-pay | Admitting: Urology

## 2019-12-07 DIAGNOSIS — E291 Testicular hypofunction: Secondary | ICD-10-CM

## 2019-12-10 ENCOUNTER — Telehealth: Payer: Self-pay | Admitting: Urology

## 2019-12-10 ENCOUNTER — Ambulatory Visit: Payer: BC Managed Care – PPO

## 2019-12-10 NOTE — Telephone Encounter (Signed)
Pt called office to r/s appt for testosterone injection today.  He said his pharmacy told him it needed a PA from our office and that could take up to 48 hrs.  I r/s his injection to Thursday.  Can you check on this please?

## 2019-12-11 NOTE — Telephone Encounter (Signed)
PA has been started. Waiting on response.

## 2019-12-11 NOTE — Telephone Encounter (Signed)
Patient notified PA has been approved.

## 2019-12-13 ENCOUNTER — Ambulatory Visit (INDEPENDENT_AMBULATORY_CARE_PROVIDER_SITE_OTHER): Payer: BC Managed Care – PPO

## 2019-12-13 ENCOUNTER — Other Ambulatory Visit: Payer: Self-pay

## 2019-12-13 ENCOUNTER — Ambulatory Visit: Payer: BC Managed Care – PPO

## 2019-12-13 DIAGNOSIS — E291 Testicular hypofunction: Secondary | ICD-10-CM

## 2019-12-13 MED ORDER — TESTOSTERONE CYPIONATE 200 MG/ML IM SOLN
200.0000 mg | Freq: Once | INTRAMUSCULAR | Status: AC
  Administered 2019-12-13: 200 mg via INTRAMUSCULAR

## 2019-12-13 NOTE — Progress Notes (Signed)
Testosterone IM Injection  Due to Hypogonadism patient is present today for a Testosterone Injection.  Medication: Testosterone Cypionate Dose: 67ml Location: right upper outer buttocks Lot: [id number] Exp:10/2020  Patient tolerated well, no complications were noted  Preformed by: Eligha Bridegroom, CMA  Follow up: 2wk injection

## 2019-12-27 ENCOUNTER — Ambulatory Visit (INDEPENDENT_AMBULATORY_CARE_PROVIDER_SITE_OTHER): Payer: BC Managed Care – PPO

## 2019-12-27 ENCOUNTER — Other Ambulatory Visit: Payer: Self-pay

## 2019-12-27 DIAGNOSIS — E291 Testicular hypofunction: Secondary | ICD-10-CM

## 2019-12-27 MED ORDER — TESTOSTERONE CYPIONATE 200 MG/ML IM SOLN
200.0000 mg | Freq: Once | INTRAMUSCULAR | Status: AC
  Administered 2019-12-27: 200 mg via INTRAMUSCULAR

## 2019-12-27 NOTE — Progress Notes (Signed)
ATestosterone IM Injection  Due to Hypogonadism patient is present today for a Testosterone Injection.  Medication: Testosterone Cypionate Dose: 1 ml Location: left upper outer buttocks Lot: [id number]  Exp:11/22/2020  Patient tolerated well, no complications were noted  Preformed by: Gerarda Gunther, RMA Follow up: 2 weeks

## 2020-01-14 ENCOUNTER — Other Ambulatory Visit: Payer: Self-pay

## 2020-01-14 ENCOUNTER — Ambulatory Visit (INDEPENDENT_AMBULATORY_CARE_PROVIDER_SITE_OTHER): Payer: BC Managed Care – PPO

## 2020-01-14 DIAGNOSIS — E291 Testicular hypofunction: Secondary | ICD-10-CM | POA: Diagnosis not present

## 2020-01-14 MED ORDER — TESTOSTERONE CYPIONATE 200 MG/ML IM SOLN
200.0000 mg | Freq: Once | INTRAMUSCULAR | Status: AC
  Administered 2020-01-14: 200 mg via INTRAMUSCULAR

## 2020-01-14 NOTE — Progress Notes (Signed)
Testosterone IM Injection  Due to Hypogonadism patient is present today for a Testosterone Injection.  Medication: Testosterone Cypionate Dose: 11ml Location: right upper outer buttocks Lot: [id number] Exp:11/22/2020  Patient tolerated well, no complications were noted  Preformed by: Eligha Bridegroom, CMA  Follow up: 2wk injection

## 2020-01-28 ENCOUNTER — Ambulatory Visit (INDEPENDENT_AMBULATORY_CARE_PROVIDER_SITE_OTHER): Payer: BC Managed Care – PPO

## 2020-01-28 ENCOUNTER — Other Ambulatory Visit: Payer: Self-pay

## 2020-01-28 DIAGNOSIS — E291 Testicular hypofunction: Secondary | ICD-10-CM

## 2020-01-28 MED ORDER — TESTOSTERONE CYPIONATE 200 MG/ML IM SOLN
200.0000 mg | Freq: Once | INTRAMUSCULAR | Status: AC
  Administered 2020-01-28: 200 mg via INTRAMUSCULAR

## 2020-01-28 NOTE — Progress Notes (Signed)
Testosterone IM Injection  Due to Hypogonadism patient is present today for a Testosterone Injection.  Medication: Testosterone Cypionate Dose: 27mL Location: right upper outer buttocks Lot: [id number] Exp:10/24/2020  Patient tolerated well, no complications were noted.  Preformed VN:[id number] Ezabella Teska, CMA   Follow up: RTC in 2 wks as scheduled

## 2020-02-11 ENCOUNTER — Other Ambulatory Visit: Payer: Self-pay

## 2020-02-11 ENCOUNTER — Ambulatory Visit (INDEPENDENT_AMBULATORY_CARE_PROVIDER_SITE_OTHER): Payer: BC Managed Care – PPO

## 2020-02-11 DIAGNOSIS — E291 Testicular hypofunction: Secondary | ICD-10-CM | POA: Diagnosis not present

## 2020-02-11 MED ORDER — TESTOSTERONE CYPIONATE 200 MG/ML IM SOLN
200.0000 mg | Freq: Once | INTRAMUSCULAR | Status: AC
  Administered 2020-02-11: 200 mg via INTRAMUSCULAR

## 2020-02-11 NOTE — Progress Notes (Signed)
Testosterone IM Injection  Due to Hypogonadism patient is present today for a Testosterone Injection.  Medication: Testosterone Cypionate Dose: 66mL Location: left upper outer buttocks Lot: [id number] Exp:10/2020  Patient tolerated well, no complications were noted.  Preformed by: Debbe Bales, CMA   Follow up:RTC in 2 weeks as scheduled

## 2020-02-13 ENCOUNTER — Other Ambulatory Visit: Payer: Self-pay | Admitting: Urology

## 2020-02-13 DIAGNOSIS — N5201 Erectile dysfunction due to arterial insufficiency: Secondary | ICD-10-CM

## 2020-02-25 ENCOUNTER — Ambulatory Visit (INDEPENDENT_AMBULATORY_CARE_PROVIDER_SITE_OTHER): Payer: BC Managed Care – PPO

## 2020-02-25 ENCOUNTER — Other Ambulatory Visit: Payer: Self-pay

## 2020-02-25 DIAGNOSIS — E291 Testicular hypofunction: Secondary | ICD-10-CM | POA: Diagnosis not present

## 2020-02-25 MED ORDER — TESTOSTERONE CYPIONATE 200 MG/ML IM SOLN
200.0000 mg | Freq: Once | INTRAMUSCULAR | Status: AC
  Administered 2020-02-25: 200 mg via INTRAMUSCULAR

## 2020-02-25 NOTE — Progress Notes (Signed)
Testosterone IM Injection  Due to Hypogonadism patient is present today for a Testosterone Injection.  Medication: Testosterone Cypionate Dose: 22mL Location: right upper outer buttocks Lot: [id number] Exp:10/2020  Patient tolerated well, no complications were noted.  Preformed by: Debbe Bales, CMA   Follow up: RTC in 2 wks as scheduled.

## 2020-03-11 ENCOUNTER — Other Ambulatory Visit: Payer: Self-pay

## 2020-03-11 ENCOUNTER — Ambulatory Visit (INDEPENDENT_AMBULATORY_CARE_PROVIDER_SITE_OTHER): Payer: BC Managed Care – PPO | Admitting: *Deleted

## 2020-03-11 DIAGNOSIS — E291 Testicular hypofunction: Secondary | ICD-10-CM

## 2020-03-11 MED ORDER — TESTOSTERONE CYPIONATE 200 MG/ML IM SOLN
200.0000 mg | Freq: Once | INTRAMUSCULAR | Status: AC
  Administered 2020-03-11: 200 mg via INTRAMUSCULAR

## 2020-03-11 NOTE — Progress Notes (Signed)
Testosterone IM Injection  Due to Hypogonadism patient is present today for a Testosterone Injection.  Medication: Testosterone Cypionate Dose: 109ml Location: left upper outer buttocks Lot: [id number] a Exp:10/2020  Patient tolerated well, no complications were noted  Preformed FB:PZWCHEN Atia Haupt   Follow up: 2 weeks

## 2020-03-25 ENCOUNTER — Ambulatory Visit (INDEPENDENT_AMBULATORY_CARE_PROVIDER_SITE_OTHER): Payer: BC Managed Care – PPO | Admitting: *Deleted

## 2020-03-25 ENCOUNTER — Other Ambulatory Visit: Payer: Self-pay

## 2020-03-25 DIAGNOSIS — E291 Testicular hypofunction: Secondary | ICD-10-CM

## 2020-03-25 MED ORDER — TESTOSTERONE CYPIONATE 200 MG/ML IM SOLN
200.0000 mg | Freq: Once | INTRAMUSCULAR | Status: AC
  Administered 2020-03-25: 200 mg via INTRAMUSCULAR

## 2020-03-25 NOTE — Progress Notes (Signed)
Testosterone IM Injection  Due to Hypogonadism patient is present today for a Testosterone Injection.  Medication: Testosterone Cypionate Dose:1ml Location:leftupper outer buttocks Lot:[id number] Exp:10/2020  Patient tolerated well,no complications were noted  Preformed by:Haydn Cush  Follow up:2 week 

## 2020-04-08 ENCOUNTER — Ambulatory Visit (INDEPENDENT_AMBULATORY_CARE_PROVIDER_SITE_OTHER): Payer: BC Managed Care – PPO | Admitting: *Deleted

## 2020-04-08 ENCOUNTER — Other Ambulatory Visit: Payer: Self-pay

## 2020-04-08 DIAGNOSIS — E291 Testicular hypofunction: Secondary | ICD-10-CM | POA: Diagnosis not present

## 2020-04-08 MED ORDER — TESTOSTERONE CYPIONATE 200 MG/ML IM SOLN
200.0000 mg | Freq: Once | INTRAMUSCULAR | Status: AC
  Administered 2020-04-08: 200 mg via INTRAMUSCULAR

## 2020-04-08 NOTE — Progress Notes (Signed)
Testosterone IM Injection  Due to Hypogonadism patient is present today for a Testosterone Injection.  Medication: Testosterone Cypionate Dose:7ml Location:leftupper outer buttocks [id number] Exp:10/2020  Patient tolerated well,no complications were noted  Preformed MV:HQIONGE Henessy Rohrer  Follow up:2 week

## 2020-04-22 ENCOUNTER — Other Ambulatory Visit: Payer: Self-pay

## 2020-04-22 ENCOUNTER — Other Ambulatory Visit: Payer: Self-pay | Admitting: *Deleted

## 2020-04-22 ENCOUNTER — Ambulatory Visit (INDEPENDENT_AMBULATORY_CARE_PROVIDER_SITE_OTHER): Payer: BC Managed Care – PPO | Admitting: *Deleted

## 2020-04-22 DIAGNOSIS — E291 Testicular hypofunction: Secondary | ICD-10-CM

## 2020-04-22 MED ORDER — TESTOSTERONE CYPIONATE 200 MG/ML IM SOLN
200.0000 mg | Freq: Once | INTRAMUSCULAR | Status: AC
Start: 2020-04-22 — End: 2020-04-22
  Administered 2020-04-22: 200 mg via INTRAMUSCULAR

## 2020-04-22 MED ORDER — TESTOSTERONE CYPIONATE 200 MG/ML IM SOLN: 200.0000 mg | INTRAMUSCULAR | 0 refills | Status: DC

## 2020-04-22 MED ORDER — TESTOSTERONE CYPIONATE 200 MG/ML IM SOLN: INTRAMUSCULAR | 0 refills | Status: DC

## 2020-04-22 NOTE — Progress Notes (Signed)
Testosterone IM Injection  Due to Hypogonadism patient is present today for a Testosterone Injection.  Medication: Testosterone Cypionate Dose:75ml Location:leftupper outer buttocks [id number] Exp:10/2020  Patient tolerated well,no complications were noted reorder refilled  Preformed DI:[id number] George Bush  Follow up:2 week

## 2020-05-02 ENCOUNTER — Ambulatory Visit: Payer: Self-pay | Admitting: Urology

## 2020-05-09 ENCOUNTER — Ambulatory Visit (INDEPENDENT_AMBULATORY_CARE_PROVIDER_SITE_OTHER): Payer: BC Managed Care – PPO | Admitting: Urology

## 2020-05-09 ENCOUNTER — Other Ambulatory Visit: Payer: Self-pay

## 2020-05-09 ENCOUNTER — Encounter: Payer: Self-pay | Admitting: Urology

## 2020-05-09 VITALS — BP 124/83 | HR 92 | Ht 70.0 in | Wt 262.0 lb

## 2020-05-09 DIAGNOSIS — N5201 Erectile dysfunction due to arterial insufficiency: Secondary | ICD-10-CM | POA: Diagnosis not present

## 2020-05-09 DIAGNOSIS — E291 Testicular hypofunction: Secondary | ICD-10-CM

## 2020-05-09 NOTE — Progress Notes (Signed)
05/09/2020 8:38 AM   George Bush 06/25/1965 [id number]  Referring provider: Aldean Jewett, MD [place],  [place] [address]  Chief Complaint  Patient presents with  . Hypogonadism    HPI: 55 y.o. male presents for annual follow-up of hypogonadism and erectile dysfunction.   Receiving injections in office every 2 weeks  No bothersome LUTS  No breast tenderness/enlargement  Last labs July 2021: Testosterone 372, hematocrit 42.7   PMH: Past Medical History:  Diagnosis Date  . HIV (human immunodeficiency virus infection) [facility])     Surgical History: Past Surgical History:  Procedure Laterality Date  . DISTAL BICEPS TENDON REPAIR Right 03/20/2019   Procedure: DISTAL BICEPS TENDON REPAIR;  Surgeon: Leim Fabry, MD;  Location: [facility] ORS;  Service: Orthopedics;  Laterality: Right;  . SKIN SURGERY      Home Medications:  Allergies as of 05/09/2020   No Known Allergies     Medication List       Accurate as of May 09, 2020  8:38 AM. If you have any questions, ask your nurse or doctor.        atorvastatin 40 MG tablet Commonly known as: LIPITOR Take 40 mg by mouth daily.   elvitegravir-cobicistat-emtricitabine-tenofovir 150-150-200-10 MG Tabs tablet Commonly known as: GENVOYA Take 1 tablet by mouth daily.   Fifty50 Glucose Meter 2.0 w/Device Kit by Other route once for 1 dose. Use as instructed which ever meter insurance will cover   lisinopril-hydrochlorothiazide 20-25 MG tablet Commonly known as: ZESTORETIC Take 1 tablet by mouth daily.   metFORMIN 1000 MG tablet Commonly known as: GLUCOPHAGE Take 1,000 mg by mouth 2 (two) times daily.   Ozempic (1 MG/DOSE) 4 MG/3ML Sopn Generic drug: Semaglutide (1 MG/DOSE) INJECT 1MG UNDER THE SKIN EVERY 7 DAYS What changed: Another medication with the same name was removed. Continue taking this medication, and follow the directions you see here. Changed by: Abbie Sons, MD   Precision QID Test test strip Generic drug: glucose blood Dispense 200 blood glucose test strips, ok to sub any brand preferred by insurance/patient, use up to 2/day   QC Aspirin 325 MG tablet Generic drug: aspirin Take 325 mg by mouth daily.   rosuvastatin 20 MG tablet Commonly known as: CRESTOR Take 20 mg by mouth daily.   sildenafil 20 MG tablet Commonly known as: REVATIO TAKE 2-5 TABLETS BY MOUTH DAILY AS NEEDED FOR ERECTILE DYSFUNCTION   tadalafil 20 MG tablet Commonly known as: CIALIS TAKE ONE TABLET BY MOUTH DAILY AS NEEDED FOR ERECTILE DYSFUNCTION   testosterone cypionate 200 MG/ML injection Commonly known as: DEPOTESTOSTERONE CYPIONATE INJECT 1ML INTO THE MUSCLE EVERY 14 DAYSAS DIRECTED   Tresiba FlexTouch 100 UNIT/ML FlexTouch Pen Generic drug: insulin degludec SMARTSIG:0.5 Milliliter(s) SUB-Q Daily   vitamin B-12 500 MCG tablet Commonly known as: CYANOCOBALAMIN Take 1,000 mcg by mouth daily.       Allergies: No Known Allergies  Family History: No family history on file.  Social History:  reports that he has never smoked. He has never used smokeless tobacco. He reports that he does not drink alcohol and does not use drugs.   Physical Exam: BP 124/83 (BP Location: Left Arm, Patient Position: Sitting, Cuff Size: Large)   Pulse 92   Ht _0  (1.778 m)   Wt 262 lb (118.8 kg)   BMI 37.59 kg/m   Constitutional:  Alert and oriented, No acute distress. HEENT: [place] AT, moist mucus membranes.  Trachea  midline, no masses. Cardiovascular: No clubbing, cyanosis, or edema. Respiratory: Normal respiratory effort, no increased work of breathing. GI: Abdomen is soft, nontender, nondistended, no abdominal masses GU: Prostate 30 g, smooth without nodules Skin: No rashes, bruises or suspicious lesions. Neurologic: Grossly intact, no focal deficits, moving all 4 extremities. Psychiatric: Normal mood and affect.   Assessment & Plan:    1.   Hypogonadism  Stable symptoms on TRT  Testosterone, PSA, hematocrit drawn today  Lab visit 6 months testosterone/HCT  He did not bring his testosterone today for an injection and will reschedule  2.  Erectile dysfunction  Stable   Abbie Sons, MD  [facility] [address], [place], [place] [address] [phone]

## 2020-05-10 LAB — TESTOSTERONE: Testosterone: 71 ng/dL — ABNORMAL LOW (ref 264–916)

## 2020-05-10 LAB — HEMATOCRIT: Hematocrit: 43.8 % (ref 37.5–51.0)

## 2020-05-10 LAB — PSA: Prostate Specific Ag, Serum: 0.6 ng/mL (ref 0.0–4.0)

## 2020-05-13 ENCOUNTER — Telehealth: Payer: Self-pay | Admitting: *Deleted

## 2020-05-13 NOTE — Telephone Encounter (Signed)
Left message on voicemail.

## 2020-05-13 NOTE — Telephone Encounter (Signed)
-----   Message from Riki Altes, MD sent at 05/11/2020  9:57 AM EST ----- PSA/hematocrit normal and stable.  Testosterone was low as he has been off replacement for 1 month

## 2020-05-16 ENCOUNTER — Ambulatory Visit (INDEPENDENT_AMBULATORY_CARE_PROVIDER_SITE_OTHER): Payer: BC Managed Care – PPO

## 2020-05-16 ENCOUNTER — Other Ambulatory Visit: Payer: Self-pay | Admitting: Urology

## 2020-05-16 ENCOUNTER — Other Ambulatory Visit: Payer: Self-pay

## 2020-05-16 DIAGNOSIS — E291 Testicular hypofunction: Secondary | ICD-10-CM

## 2020-05-16 MED ORDER — TESTOSTERONE CYPIONATE 200 MG/ML IM SOLN
200.0000 mg | Freq: Once | INTRAMUSCULAR | Status: AC
Start: 2020-05-16 — End: 2020-05-16
  Administered 2020-05-16: 200 mg via INTRAMUSCULAR

## 2020-05-16 NOTE — Progress Notes (Signed)
Testosterone IM Injection  Due to Hypogonadism patient is present today for a Testosterone Injection.  Medication: Testosterone Cypionate Dose: 200mg /82mL  Location: left upper outer buttocks Lot: [id number] Exp:08/2021  Patient tolerated well, no complications were noted. Patient was given injection from in house supply as the Testosterone he brought with him was frozen solid and crystallized therefore unsafe to inject. Patient was instructed to return medication to pharmacy to possibly request refund. Patient voiced understanding.  Preformed by: 09/2021, CMA   Follow up: RTC in 2 wks for injection

## 2020-05-30 ENCOUNTER — Other Ambulatory Visit: Payer: Self-pay

## 2020-05-30 ENCOUNTER — Ambulatory Visit (INDEPENDENT_AMBULATORY_CARE_PROVIDER_SITE_OTHER): Payer: BC Managed Care – PPO | Admitting: *Deleted

## 2020-05-30 DIAGNOSIS — E291 Testicular hypofunction: Secondary | ICD-10-CM

## 2020-05-30 MED ORDER — TESTOSTERONE CYPIONATE 200 MG/ML IM SOLN
200.0000 mg | Freq: Once | INTRAMUSCULAR | Status: AC
  Administered 2020-05-30: 200 mg via INTRAMUSCULAR

## 2020-05-30 NOTE — Progress Notes (Signed)
Testosterone IM Injection  Due to Hypogonadism patient is present today for a Testosterone Injection.  Medication: Testosterone Cypionate Dose: 1 ml Location: right upper outer buttocks Lot: [id number] Exp:08/2021  Patient tolerated well, no complications were noted  Performed by: Milas Kocher, CMA  Follow up: 2 week

## 2020-06-13 ENCOUNTER — Ambulatory Visit (INDEPENDENT_AMBULATORY_CARE_PROVIDER_SITE_OTHER): Payer: BC Managed Care – PPO | Admitting: *Deleted

## 2020-06-13 ENCOUNTER — Other Ambulatory Visit: Payer: Self-pay

## 2020-06-13 DIAGNOSIS — E291 Testicular hypofunction: Secondary | ICD-10-CM

## 2020-06-13 MED ORDER — TESTOSTERONE CYPIONATE 200 MG/ML IM SOLN
200.0000 mg | Freq: Once | INTRAMUSCULAR | Status: AC
  Administered 2020-06-13: 200 mg via INTRAMUSCULAR

## 2020-06-13 NOTE — Progress Notes (Signed)
Testosterone IM Injection  Due to Hypogonadism patient is present today for a Testosterone Injection.  Medication: Testosterone Cypionate Dose: 25ml Location: left upper outer buttocks Lot: [id number] Exp:05/23  Patient tolerated well, no complications were noted  Peformed by: Milas Kocher, CMA  Follow up: 2 weeks as scheduled

## 2020-06-22 ENCOUNTER — Other Ambulatory Visit: Payer: Self-pay | Admitting: Urology

## 2020-06-22 DIAGNOSIS — N5201 Erectile dysfunction due to arterial insufficiency: Secondary | ICD-10-CM

## 2020-06-27 ENCOUNTER — Other Ambulatory Visit: Payer: Self-pay

## 2020-06-27 ENCOUNTER — Ambulatory Visit (INDEPENDENT_AMBULATORY_CARE_PROVIDER_SITE_OTHER): Payer: BC Managed Care – PPO

## 2020-06-27 DIAGNOSIS — E291 Testicular hypofunction: Secondary | ICD-10-CM

## 2020-06-27 MED ORDER — TESTOSTERONE CYPIONATE 200 MG/ML IM SOLN
200.0000 mg | Freq: Once | INTRAMUSCULAR | Status: AC
  Administered 2020-06-27: 200 mg via INTRAMUSCULAR

## 2020-06-27 NOTE — Progress Notes (Signed)
Testosterone IM Injection  Due to Hypogonadism patient is present today for a Testosterone Injection.  Medication: Testosterone Cypionate Dose: 1 ml Location: right upper outer buttocks Lot: [id number]  Exp:08/2021  Patient tolerated well, no complications were noted  Preformed by: Gerarda Gunther, RMA  Follow up: 2 weeks

## 2020-07-11 ENCOUNTER — Other Ambulatory Visit: Payer: Self-pay

## 2020-07-11 ENCOUNTER — Ambulatory Visit (INDEPENDENT_AMBULATORY_CARE_PROVIDER_SITE_OTHER): Payer: BC Managed Care – PPO

## 2020-07-11 DIAGNOSIS — E291 Testicular hypofunction: Secondary | ICD-10-CM | POA: Diagnosis not present

## 2020-07-11 MED ORDER — TESTOSTERONE CYPIONATE 200 MG/ML IM SOLN
200.0000 mg | Freq: Once | INTRAMUSCULAR | Status: AC
  Administered 2020-07-11: 200 mg via INTRAMUSCULAR

## 2020-07-11 NOTE — Progress Notes (Signed)
Testosterone IM Injection  Due to Hypogonadism patient is present today for a Testosterone Injection.  Medication: Testosterone Cypionate Dose: 9mL Location: left upper outer buttocks Lot: [id number] Exp:08/2021  Patient tolerated well, no complications were noted.  Preformed by: Debbe Bales, CMA  Follow up: RTC in 2 wks

## 2020-07-20 IMAGING — MR MR ELBOW*R* W/O CM
5 series · 40 of 40 positions shown · non-contrast
Comparison: None.

CLINICAL DATA: Elbow pain after picking up fence 3 weeks ago which

EXAM:
MRI OF THE RIGHT ELBOW WITHOUT CONTRAST
TECHNIQUE: Multiplanar, multisequence MR imaging of the elbow was performed. No
intravenous contrast was administered.

[Series 3: T1 · axial · right · 3.0mm · 0.47mm/px · z∈[-87,+7]mm · 7 of 25 slices shown (1 of 2)]
[im 1/25]
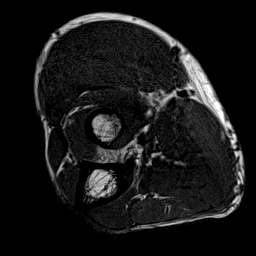
[im 5/25]
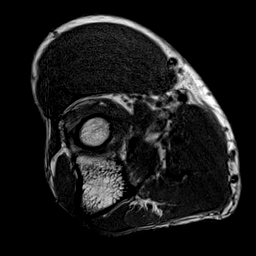
[im 9/25]
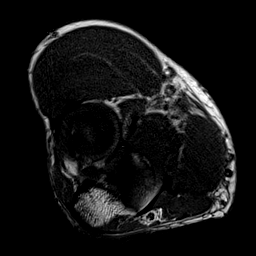
[im 13/25]
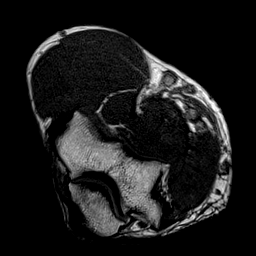
[im 17/25]
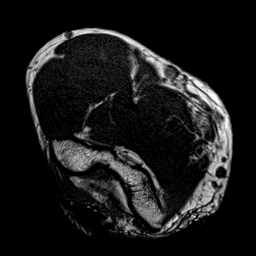
[im 21/25]
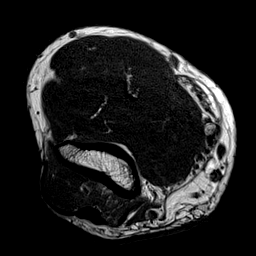
[im 25/25]
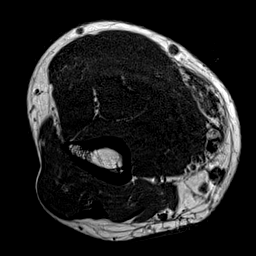

[Series 4: T2 fat-sat · axial · right · 3.0mm · 0.47mm/px · z∈[-87,+7]mm · 7 of 25 slices shown (1 of 2)]
[im 1/25]
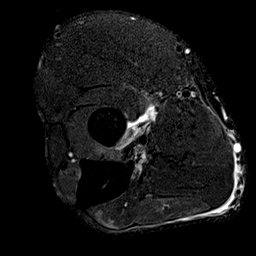
[im 5/25]
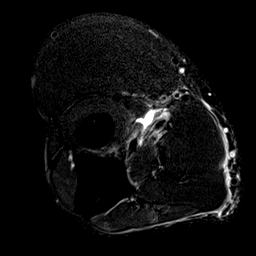
[im 9/25]
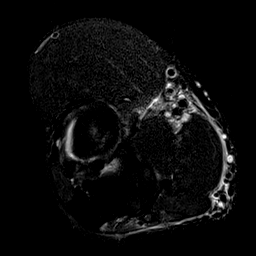
[im 13/25]
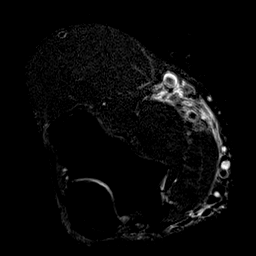
[im 17/25]
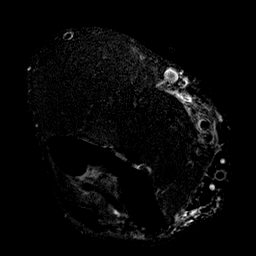
[im 21/25]
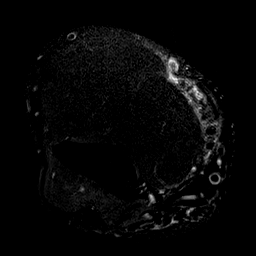
[im 25/25]
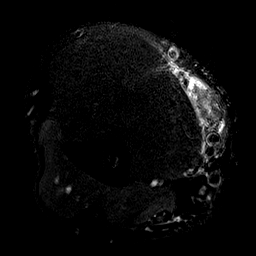

[Series 5: T2 fat-sat · coronal · right · 3.0mm · 0.55mm/px · 8 of 26 slices shown (2 of 2)]
[im 1/26]
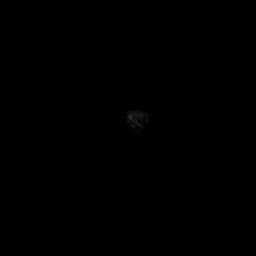
[im 4/26]
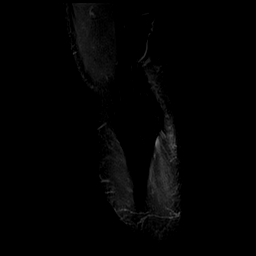
[im 8/26]
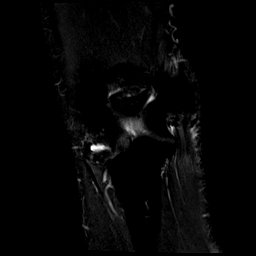
[im 11/26]
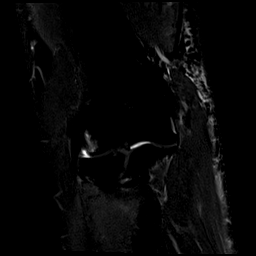
[im 15/26]
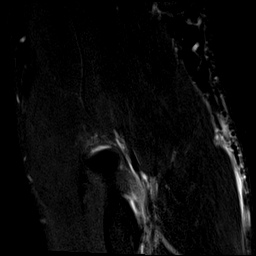
[im 18/26]
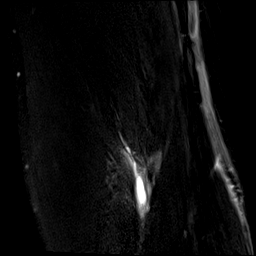
[im 22/26]
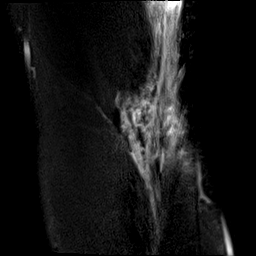
[im 26/26]
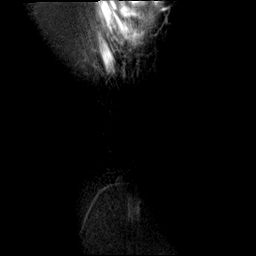

[Series 6: T1 · coronal · right · 3.0mm · 0.55mm/px · 8 of 26 slices shown (2 of 2)]
[im 1/26]
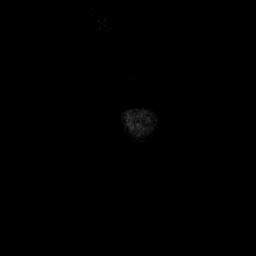
[im 4/26]
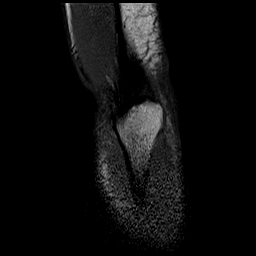
[im 8/26]
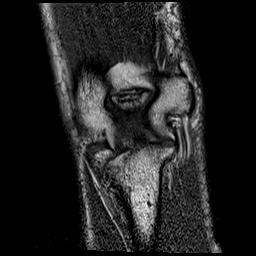
[im 11/26]
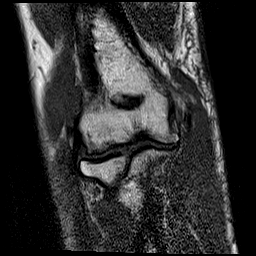
[im 15/26]
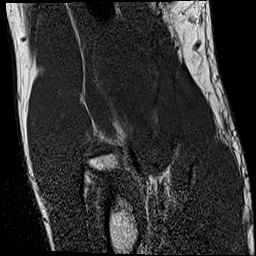
[im 18/26]
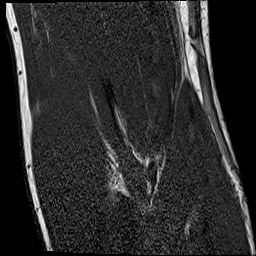
[im 22/26]
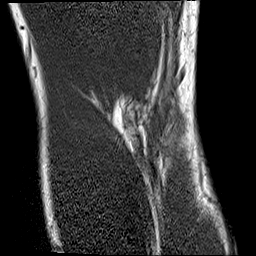
[im 26/26]
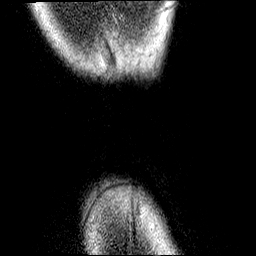

[Series 7: PD fat-sat · sagittal · right · 3.0mm · 0.55mm/px · 10 of 32 slices shown]
[im 1/32]
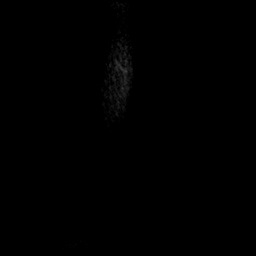
[im 4/32]
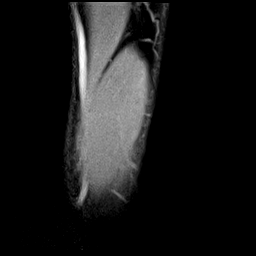
[im 7/32]
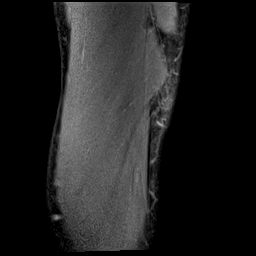
[im 11/32]
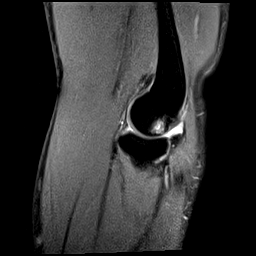
[im 14/32]
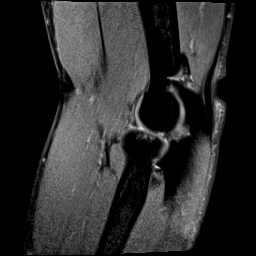
[im 18/32]
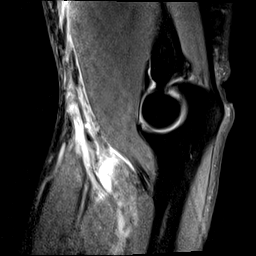
[im 21/32]
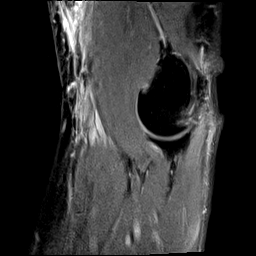
[im 25/32]
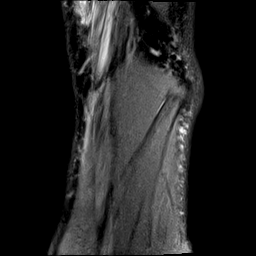
[im 28/32]
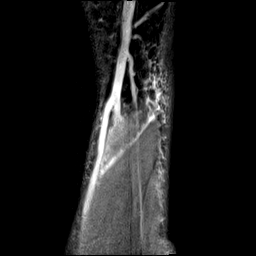
[im 32/32]
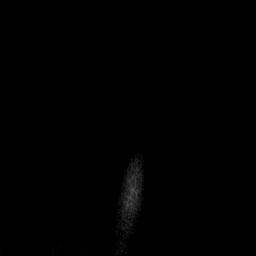

[40 of 40 positions shown; findings below may reference images not displayed]

FINDINGS: TENDONS

Common forearm flexor origin: There is increased intrasubstance
signal with thickening seen at the insertion of the common flexor
tendon.

Common forearm extensor origin: Intact with normal signal.

Biceps: There is a focal disruption of the biceps tendon from the
radial tuberosity with surrounding fluid signal and heterogeneous
signal extending into the bicipital tract. There is approximately 6
cm of tendon retraction. Edema seen along the anterior elbow into
the myotendinous junction.

Brachialis: There is mildly increased intrasubstance signal seen at
the insertion site of the brachialis tendon.

Triceps: Intact with normal signal.

LIGAMENTS

Medial stabilizers: The ulnar collateral ligament is intact.

Lateral stabilizers: The lateral ulnar and radial collateral
ligaments appear intact.

Cartilage: There is joint space loss seen at the radiocapitellar
joint with subchondral cystic changes. There is mild chondral
irregularity.

Joint: Trace elbow joint effusion.  No loose body observed.

Cubital tunnel: Unremarkable.  The ulnar nerve appears normal.

Bones: No fracture, marrow edema, or pathologic marrow abnormality.

Other: Mild soft tissue edema seen around the anterior elbow.
IMPRESSION: 1. Complete disruption of the biceps tendon at the radial tuberosity
with surrounding soft tissue edema and fluid in the tract with
approximately 6 cm of tendon retraction. There is edema at the
myotendinous junction.
2. Insertional brachialis tendinosis
3. Common flexor tendinosis.
4. Radiocapitellar joint osteoarthritis
5. Trace elbow joint effusion.

These results will be called to the ordering clinician or
representative by the Radiologist Assistant, and communication
documented in the PACS or zVision Dashboard.

## 2020-07-25 ENCOUNTER — Other Ambulatory Visit: Payer: Self-pay

## 2020-07-25 ENCOUNTER — Ambulatory Visit (INDEPENDENT_AMBULATORY_CARE_PROVIDER_SITE_OTHER): Payer: BC Managed Care – PPO

## 2020-07-25 DIAGNOSIS — N5201 Erectile dysfunction due to arterial insufficiency: Secondary | ICD-10-CM

## 2020-07-25 DIAGNOSIS — E291 Testicular hypofunction: Secondary | ICD-10-CM | POA: Diagnosis not present

## 2020-07-25 MED ORDER — TESTOSTERONE CYPIONATE 200 MG/ML IM SOLN
200.0000 mg | Freq: Once | INTRAMUSCULAR | Status: AC
  Administered 2020-07-25: 200 mg via INTRAMUSCULAR

## 2020-07-25 NOTE — Progress Notes (Signed)
Testosterone IM Injection  Due to Hypogonadism patient is present today for a Testosterone Injection.  Medication: Testosterone Cypionate Dose: 33mL Location: right upper outer buttocks Lot: [id number] Exp:08/24/2021  Patient tolerated well, no complications were noted.  Preformed by: Debbe Bales, CMA   Follow up: RTC in 2

## 2020-08-11 ENCOUNTER — Ambulatory Visit: Payer: Self-pay

## 2020-08-11 ENCOUNTER — Encounter: Payer: Self-pay | Admitting: Urology

## 2020-08-15 ENCOUNTER — Other Ambulatory Visit: Payer: Self-pay

## 2020-08-15 ENCOUNTER — Ambulatory Visit (INDEPENDENT_AMBULATORY_CARE_PROVIDER_SITE_OTHER): Payer: BC Managed Care – PPO

## 2020-08-15 DIAGNOSIS — E291 Testicular hypofunction: Secondary | ICD-10-CM | POA: Diagnosis not present

## 2020-08-15 MED ORDER — TESTOSTERONE CYPIONATE 200 MG/ML IM SOLN
200.0000 mg | Freq: Once | INTRAMUSCULAR | Status: AC
  Administered 2020-08-15: 200 mg via INTRAMUSCULAR

## 2020-08-15 NOTE — Progress Notes (Signed)
Testosterone IM Injection  Due to Hypogonadism patient is present today for a Testosterone Injection.  Medication: Testosterone Cypionate Dose: 58ml Location: left upper outer buttocks Lot: [id number] Exp:08/2021  Patient tolerated well, no complications were noted  Preformed by: Eligha Bridegroom, CMA  Follow up: 2weeks

## 2020-08-29 ENCOUNTER — Encounter: Payer: Self-pay | Admitting: *Deleted

## 2020-08-29 ENCOUNTER — Ambulatory Visit (INDEPENDENT_AMBULATORY_CARE_PROVIDER_SITE_OTHER): Payer: BC Managed Care – PPO | Admitting: *Deleted

## 2020-08-29 ENCOUNTER — Other Ambulatory Visit: Payer: Self-pay

## 2020-08-29 DIAGNOSIS — E291 Testicular hypofunction: Secondary | ICD-10-CM

## 2020-08-29 MED ORDER — TESTOSTERONE CYPIONATE 200 MG/ML IM SOLN
200.0000 mg | Freq: Once | INTRAMUSCULAR | Status: AC
  Administered 2020-08-29: 200 mg via INTRAMUSCULAR

## 2020-08-29 NOTE — Progress Notes (Signed)
Testosterone IM Injection  Due to Hypogonadism patient is present today for a Testosterone Injection.  Medication: Testosterone Cypionate Dose: 72ml Location: right upper outer buttocks Lot: [id number] Exp:09/23/21  Patient tolerated well, no complications were noted  Performed by: Milas Kocher, CMA  Follow up: 2 weeks

## 2020-09-12 ENCOUNTER — Encounter: Payer: Self-pay | Admitting: Urology

## 2020-09-12 ENCOUNTER — Ambulatory Visit: Payer: Self-pay

## 2020-09-21 ENCOUNTER — Other Ambulatory Visit: Payer: Self-pay

## 2020-09-21 ENCOUNTER — Encounter: Payer: Self-pay | Admitting: Emergency Medicine

## 2020-09-21 ENCOUNTER — Emergency Department
Admission: EM | Admit: 2020-09-21 | Discharge: 2020-09-21 | Disposition: A | Discharge status: Home / Self Care | Payer: BC Managed Care – PPO | Attending: Emergency Medicine | Admitting: Emergency Medicine

## 2020-09-21 DIAGNOSIS — S61211A Laceration without foreign body of left index finger without damage to nail, initial encounter: Secondary | ICD-10-CM | POA: Diagnosis not present

## 2020-09-21 DIAGNOSIS — E1140 Type 2 diabetes mellitus with diabetic neuropathy, unspecified: Secondary | ICD-10-CM | POA: Insufficient documentation

## 2020-09-21 DIAGNOSIS — I10 Essential (primary) hypertension: Secondary | ICD-10-CM | POA: Insufficient documentation

## 2020-09-21 DIAGNOSIS — S6992XA Unspecified injury of left wrist, hand and finger(s), initial encounter: Secondary | ICD-10-CM | POA: Diagnosis present

## 2020-09-21 DIAGNOSIS — Z7984 Long term (current) use of oral hypoglycemic drugs: Secondary | ICD-10-CM | POA: Insufficient documentation

## 2020-09-21 DIAGNOSIS — Z79899 Other long term (current) drug therapy: Secondary | ICD-10-CM | POA: Insufficient documentation

## 2020-09-21 DIAGNOSIS — W268XXA Contact with other sharp object(s), not elsewhere classified, initial encounter: Secondary | ICD-10-CM | POA: Diagnosis not present

## 2020-09-21 DIAGNOSIS — Z21 Asymptomatic human immunodeficiency virus [HIV] infection status: Secondary | ICD-10-CM | POA: Insufficient documentation

## 2020-09-21 DIAGNOSIS — Z23 Encounter for immunization: Secondary | ICD-10-CM | POA: Insufficient documentation

## 2020-09-21 MED ORDER — CEPHALEXIN 500 MG PO CAPS
500.0000 mg | ORAL_CAPSULE | Freq: Once | ORAL | Status: AC
  Administered 2020-09-21: 500 mg via ORAL
  Filled 2020-09-21: qty 1

## 2020-09-21 MED ORDER — CEPHALEXIN 500 MG PO CAPS: 500.0000 mg | ORAL_CAPSULE | Freq: Three times a day (TID) | ORAL | 0 refills | Status: AC

## 2020-09-21 MED ORDER — TETANUS-DIPHTH-ACELL PERTUSSIS 5-2.5-18.5 LF-MCG/0.5 IM SUSY
0.5000 mL | PREFILLED_SYRINGE | Freq: Once | INTRAMUSCULAR | Status: AC
  Administered 2020-09-21: 0.5 mL via INTRAMUSCULAR
  Filled 2020-09-21: qty 0.5

## 2020-09-21 NOTE — ED Triage Notes (Signed)
Pt reports that he cut his left index finger 4 days ago feels as if it is not healing correctly and would like it looked at so it doesn't become infected. No s/s of infection seen. He is diabetic.

## 2020-09-21 NOTE — ED Provider Notes (Signed)
[facility]-EMERGENCY DEPARTMENT  ____________________________________________  Time seen: Approximately 10:05 PM  I have reviewed the triage vital signs and the nursing notes.   HISTORY  Chief Complaint Extremity Laceration   Historian Patient     HPI George Bush is a 55 y.o. male with a history of HIV, presents to the emergency department with a 0.5 cm laceration along the distal aspect of the left index finger sustained 4 days ago.  Patient states that he also has a history of diabetes and is concerned that wound will get infected.  He has no erythema surrounding wound site.  No expression of purulence.  No other alleviating measures have been attempted.   Past Medical History:  Diagnosis Date  . HIV (human immunodeficiency virus infection) ([place])      Immunizations up to date:  Yes.     Past Medical History:  Diagnosis Date  . HIV (human immunodeficiency virus infection) [facility])     Patient Active Problem List   Diagnosis Date Noted  . Erectile dysfunction due to arterial insufficiency 10/10/2017  . Hypogonadism in male 10/10/2017  . Hypertensive retinopathy of both eyes, grade 2 10/04/2016  . B12 deficiency 10/29/2015  . Erectile dysfunction 04/26/2015  . Healthcare maintenance 11/27/2012  . AIN grade II 11/21/2012  . Dyslipidemia 11/21/2012  . Elevated lipase 11/21/2012  . HIV (human immunodeficiency virus infection) ([place]) 11/21/2012  . HTN (hypertension) 11/21/2012  . Obesity 11/21/2012  . Type 2 diabetes mellitus with diabetic nephropathy ([place]) 11/21/2012  . Condyloma acuminata 04/28/2011    Past Surgical History:  Procedure Laterality Date  . DISTAL BICEPS TENDON REPAIR Right 03/20/2019   Procedure: DISTAL BICEPS TENDON REPAIR;  Surgeon: Leim Fabry, MD;  Location: [facility] ORS;  Service: Orthopedics;  Laterality: Right;  . SKIN SURGERY      Prior to Admission medications   Medication Sig Start Date End Date Taking? Authorizing Provider   cephALEXin (KEFLEX) 500 MG capsule Take 1 capsule (500 mg total) by mouth 3 (three) times daily for 7 days. 09/21/20 09/28/20 Yes Vallarie Mare M, PA-C  atorvastatin (LIPITOR) 40 MG tablet Take 40 mg by mouth daily.  11/18/16   [provider]  Blood Glucose Monitoring Suppl (FIFTY50 GLUCOSE METER 2.0) w/Device KIT by Other route once for 1 dose. Use as instructed which ever meter insurance will cover 03/25/20   [provider]  elvitegravir-cobicistat-emtricitabine-tenofovir (GENVOYA) 150-150-200-10 MG TABS tablet Take 1 tablet by mouth daily.  07/05/16   [provider]  glucose blood (PRECISION QID TEST) test strip Dispense 200 blood glucose test strips, ok to sub any brand preferred by insurance/patient, use up to 2/day 03/25/20   [provider]  lisinopril-hydrochlorothiazide (PRINZIDE,ZESTORETIC) 20-25 MG tablet Take 1 tablet by mouth daily.  10/05/16   [provider]  metFORMIN (GLUCOPHAGE) 1000 MG tablet Take 1,000 mg by mouth 2 (two) times daily.     [provider]  OZEMPIC, 1 MG/DOSE, 4 MG/3ML SOPN INJECT 1MG UNDER THE SKIN EVERY 7 DAYS 02/28/20   [provider]  QC ASPIRIN 325 MG tablet Take 325 mg by mouth daily. 03/20/19   [provider]  rosuvastatin (CRESTOR) 20 MG tablet Take 20 mg by mouth daily. 03/26/20   [provider]  tadalafil (CIALIS) 20 MG tablet TAKE ONE TABLET BY MOUTH DAILY AS NEEDED FOR ERECTILE DYSFUNCTION 06/23/20   Stoioff, Ronda Fairly, MD  testosterone cypionate (DEPOTESTOSTERONE CYPIONATE) 200 MG/ML injection INJECT 1ML INTO THE MUSCLE EVERY 14 DAYSAS DIRECTED 04/22/20  Stoioff, Ronda Fairly, MD  TRESIBA FLEXTOUCH 100 UNIT/ML SOPN FlexTouch Pen SMARTSIG:0.5 Milliliter(s) SUB-Q Daily 04/23/19   [provider]  vitamin B-12 (CYANOCOBALAMIN) 500 MCG tablet Take 1,000 mcg by mouth daily.    [provider]    Allergies Patient has no known allergies.  No family history on  file.  Social History Social History   Tobacco Use  . Smoking status: Never Smoker  . Smokeless tobacco: Never Used  Vaping Use  . Vaping Use: Never used  Substance Use Topics  . Alcohol use: No  . Drug use: No     Review of Systems  Constitutional: No fever/chills Eyes:  No discharge ENT: No upper respiratory complaints. Respiratory: no cough. No SOB/ use of accessory muscles to breath Gastrointestinal:   No nausea, no vomiting.  No diarrhea.  No constipation. Musculoskeletal: Negative for musculoskeletal pain. Skin: Patient has left index finger laceration.   ____________________________________________   PHYSICAL EXAM:  VITAL SIGNS: ED Triage Vitals [09/21/20 2146]  Enc Vitals Group     BP (!) 157/91     Pulse Rate 78     Resp 20     Temp 98.4 F (36.9 C)     Temp Source Oral     SpO2 98 %     Weight 258 lb (117 kg)     Height 5' 11" (1.803 m)     Head Circumference      Peak Flow      Pain Score 0     Pain Loc      Pain Edu?      Excl. in [place]?      Constitutional: Alert and oriented. Well appearing and in no acute distress. Eyes: Conjunctivae are normal. PERRL. EOMI. Head: Atraumatic. ENT: Cardiovascular: Normal rate, regular rhythm. Normal S1 and S2.  Good peripheral circulation. Respiratory: Normal respiratory effort without tachypnea or retractions. Lungs CTAB. Good air entry to the bases with no decreased or absent breath sounds Gastrointestinal: Bowel sounds x 4 quadrants. Soft and nontender to palpation. No guarding or rigidity. No distention. Musculoskeletal: Full range of motion to all extremities. No obvious deformities noted Neurologic:  Normal for age. No gross focal neurologic deficits are appreciated.  Skin: Patient has 0.5 cm linear laceration at distal left index finger. Psychiatric: Mood and affect are normal for age. Speech and behavior are normal.   ____________________________________________   LABS (all labs ordered are listed,  but only abnormal results are displayed)  Labs Reviewed - No data to display ____________________________________________  EKG   ____________________________________________  RADIOLOGY   No results found.  ____________________________________________    PROCEDURES  Procedure(s) performed:     Procedures     Medications  Tdap (BOOSTRIX) injection 0.5 mL (has no administration in time range)  cephALEXin (KEFLEX) capsule 500 mg (500 mg Oral Given 09/21/20 2205)     ____________________________________________   INITIAL IMPRESSION / ASSESSMENT AND PLAN / ED COURSE  Pertinent labs & imaging results that were available during my care of the patient were reviewed by me and considered in my medical decision making (see chart for details).      Assessment and plan Index finger laceration 55 year old male presents to the emergency department with a 0.5 cm left index finger laceration sustained 4 days ago.  There is no surrounding erythema around wound site.  Patient was started on Keflex 3 times daily for the next 7 days and his tetanus status was updated prior to discharge.  ____________________________________________  FINAL CLINICAL IMPRESSION(S) / ED DIAGNOSES  Final diagnoses:  Laceration of left index finger without foreign body without damage to nail, initial encounter      NEW MEDICATIONS STARTED DURING THIS VISIT:  ED Discharge Orders         Ordered    cephALEXin (KEFLEX) 500 MG capsule  3 times daily        09/21/20 2201              This chart was dictated using voice recognition software/Dragon. Despite best efforts to proofread, errors can occur which can change the meaning. Any change was purely unintentional.     Karren Cobble 09/21/20 2209    Harvest Dark, MD 09/21/20 2347

## 2020-09-23 ENCOUNTER — Other Ambulatory Visit: Payer: Self-pay

## 2020-09-23 ENCOUNTER — Ambulatory Visit (INDEPENDENT_AMBULATORY_CARE_PROVIDER_SITE_OTHER): Payer: BC Managed Care – PPO | Admitting: Family Medicine

## 2020-09-23 ENCOUNTER — Other Ambulatory Visit: Payer: Self-pay | Admitting: Family Medicine

## 2020-09-23 DIAGNOSIS — E291 Testicular hypofunction: Secondary | ICD-10-CM

## 2020-09-23 DIAGNOSIS — N5201 Erectile dysfunction due to arterial insufficiency: Secondary | ICD-10-CM

## 2020-09-23 MED ORDER — TESTOSTERONE CYPIONATE 200 MG/ML IM SOLN
200.0000 mg | Freq: Once | INTRAMUSCULAR | Status: AC
  Administered 2020-09-23: 200 mg via INTRAMUSCULAR

## 2020-09-23 MED ORDER — TADALAFIL 20 MG PO TABS: ORAL_TABLET | ORAL | 0 refills | Status: DC

## 2020-09-23 NOTE — Progress Notes (Signed)
Testosterone IM Injection  Due to Hypogonadism patient is present today for a Testosterone Injection.  Medication: Testosterone Cypionate Dose: 19ml Location: right upper outer buttocks Lot: [id number] Exp:08/24/2021  Patient tolerated well, no complications were noted  Preformed by: Teressa Lower, CMA  Follow up: 2 weeks

## 2020-10-07 ENCOUNTER — Ambulatory Visit (INDEPENDENT_AMBULATORY_CARE_PROVIDER_SITE_OTHER): Payer: BC Managed Care – PPO

## 2020-10-07 ENCOUNTER — Other Ambulatory Visit: Payer: Self-pay

## 2020-10-07 DIAGNOSIS — E291 Testicular hypofunction: Secondary | ICD-10-CM | POA: Diagnosis not present

## 2020-10-07 MED ORDER — TESTOSTERONE CYPIONATE 200 MG/ML IM SOLN
200.0000 mg | Freq: Once | INTRAMUSCULAR | Status: AC
  Administered 2020-10-07: 200 mg via INTRAMUSCULAR

## 2020-10-07 MED ORDER — TESTOSTERONE CYPIONATE 200 MG/ML IM SOLN: INTRAMUSCULAR | 0 refills | Status: DC

## 2020-10-07 NOTE — Telephone Encounter (Signed)
Patient will need refill on Testosterone for next injection visit on 6/28. [facility] is pharmacy of choice.

## 2020-10-07 NOTE — Progress Notes (Signed)
Testosterone IM Injection  Due to Hypogonadism patient is present today for a Testosterone Injection.  Medication: Testosterone Cypionate Dose: 91mL Location: left upper outer buttocks Lot: [id number] Exp:08/2021  Patient tolerated well, no complications were noted  Performed by: Franchot Erichsen, CMA  Follow up: RTC in 2 weeks. Informed patient he will need refill on Testosterone.

## 2020-10-21 ENCOUNTER — Ambulatory Visit (INDEPENDENT_AMBULATORY_CARE_PROVIDER_SITE_OTHER): Payer: BC Managed Care – PPO | Admitting: *Deleted

## 2020-10-21 ENCOUNTER — Other Ambulatory Visit: Payer: Self-pay

## 2020-10-21 DIAGNOSIS — E291 Testicular hypofunction: Secondary | ICD-10-CM | POA: Diagnosis not present

## 2020-10-21 MED ORDER — TESTOSTERONE CYPIONATE 200 MG/ML IM SOLN
200.0000 mg | Freq: Once | INTRAMUSCULAR | Status: AC
  Administered 2020-10-21: 200 mg via INTRAMUSCULAR

## 2020-10-21 NOTE — Progress Notes (Addendum)
Testosterone IM Injection   Due to Hypogonadism patient is present today for a Testosterone Injection.   Medication: Testosterone Cypionate Dose: 44mL Location: left upper outer buttocks Lot: [id number] Exp:01/2023   Patient tolerated well, no complications were noted   Performed by: Ples Specter CMA   Follow up: RTC in 2 weeks. Informed

## 2020-10-28 ENCOUNTER — Other Ambulatory Visit: Payer: BC Managed Care – PPO

## 2020-10-28 ENCOUNTER — Other Ambulatory Visit: Payer: Self-pay

## 2020-10-28 DIAGNOSIS — E291 Testicular hypofunction: Secondary | ICD-10-CM

## 2020-10-29 LAB — HEMOGLOBIN AND HEMATOCRIT, BLOOD
Hematocrit: 40.6 % (ref 37.5–51.0)
Hemoglobin: 13.6 g/dL (ref 13.0–17.7)

## 2020-10-29 LAB — PSA: Prostate Specific Ag, Serum: 1 ng/mL (ref 0.0–4.0)

## 2020-10-29 LAB — TESTOSTERONE: Testosterone: 474 ng/dL (ref 264–916)

## 2020-10-30 ENCOUNTER — Telehealth (INDEPENDENT_AMBULATORY_CARE_PROVIDER_SITE_OTHER): Payer: BC Managed Care – PPO | Admitting: *Deleted

## 2020-10-30 NOTE — Telephone Encounter (Signed)
Left message on voice mail per DPR .  °

## 2020-10-30 NOTE — Addendum Note (Signed)
Addended by: Frankey Shown on: 10/30/2020 09:10 AM   Modules accepted: Level of Service

## 2020-10-30 NOTE — Telephone Encounter (Signed)
-----   Message from Riki Altes, MD sent at 10/30/2020  7:13 AM EDT ----- Testosterone level looks good at 474.  PSA has bumped slightly and was 1.0.  He has a follow-up scheduled in January 2023 and please schedule repeat labs (testosterone, PSA, hematocrit) prior to that appointment

## 2020-10-30 NOTE — Telephone Encounter (Signed)
Incoming call from pt on triage line, requesting lab results. Informed pt of labs. Pt states that he has noticed an increase in urinary frequency and urgency x 2 months. He is getting up at night 3-4 times to void. Denies straining or pushing to empty bladder. He wants to ensure this is not related to increase in PSA. Please advise.

## 2020-10-31 NOTE — Telephone Encounter (Signed)
Recommend lab visit for UA

## 2020-11-04 ENCOUNTER — Other Ambulatory Visit: Payer: Self-pay

## 2020-11-04 ENCOUNTER — Telehealth: Payer: Self-pay | Admitting: *Deleted

## 2020-11-04 ENCOUNTER — Other Ambulatory Visit: Payer: Self-pay | Admitting: *Deleted

## 2020-11-04 ENCOUNTER — Ambulatory Visit (INDEPENDENT_AMBULATORY_CARE_PROVIDER_SITE_OTHER): Payer: BC Managed Care – PPO | Admitting: *Deleted

## 2020-11-04 DIAGNOSIS — E291 Testicular hypofunction: Secondary | ICD-10-CM

## 2020-11-04 DIAGNOSIS — N5201 Erectile dysfunction due to arterial insufficiency: Secondary | ICD-10-CM

## 2020-11-04 LAB — MICROSCOPIC EXAMINATION
Bacteria, UA: NONE SEEN
Epithelial Cells (non renal): NONE SEEN /HPF (ref 0–10)
RBC, Urine: NONE SEEN /HPF (ref 0–2)
WBC, UA: NONE SEEN /HPF (ref 0–5)

## 2020-11-04 LAB — URINALYSIS, COMPLETE
Bilirubin, UA: NEGATIVE
Ketones, UA: NEGATIVE
Leukocytes,UA: NEGATIVE
Nitrite, UA: NEGATIVE
Specific Gravity, UA: 1.02 (ref 1.005–1.030)
Urobilinogen, Ur: 0.2 mg/dL (ref 0.2–1.0)
pH, UA: 6 (ref 5.0–7.5)

## 2020-11-04 MED ORDER — TESTOSTERONE CYPIONATE 200 MG/ML IM SOLN
200.0000 mg | Freq: Once | INTRAMUSCULAR | Status: AC
  Administered 2020-11-04: 200 mg via INTRAMUSCULAR

## 2020-11-04 MED ORDER — TESTOSTERONE CYPIONATE 200 MG/ML IM SOLN: INTRAMUSCULAR | 0 refills | Status: DC

## 2020-11-04 NOTE — Telephone Encounter (Signed)
-----   Message from Riki Altes, MD sent at 11/04/2020  2:23 PM EDT ----- Urinalysis showed no blood or evidence of infection.  The most common cause of getting up at night is sleep apnea.  If he has a history of snoring would recommend scheduling sleep study through his PCP

## 2020-11-04 NOTE — Progress Notes (Signed)
Testosterone IM Injection   Due to Hypogonadism patient is present today for a Testosterone Injection.   Medication: Testosterone Cypionate Dose: 6mL Location: left upper outer buttocks Lot: [id number] Exp:01/2023   Patient tolerated well, no complications were noted   Performed by: Ples Specter CMA   Follow up: RTC in 2 weeks. Informed patient he will need refill on Testosterone.

## 2020-11-04 NOTE — Telephone Encounter (Signed)
Notified patient as instructed,Left voice mail

## 2020-11-07 ENCOUNTER — Other Ambulatory Visit: Payer: Self-pay

## 2020-11-18 ENCOUNTER — Other Ambulatory Visit: Payer: Self-pay

## 2020-11-18 ENCOUNTER — Ambulatory Visit (INDEPENDENT_AMBULATORY_CARE_PROVIDER_SITE_OTHER): Payer: BC Managed Care – PPO | Admitting: *Deleted

## 2020-11-18 DIAGNOSIS — E291 Testicular hypofunction: Secondary | ICD-10-CM | POA: Diagnosis not present

## 2020-11-18 MED ORDER — TESTOSTERONE CYPIONATE 200 MG/ML IM SOLN
200.0000 mg | Freq: Once | INTRAMUSCULAR | Status: AC
  Administered 2020-11-18: 200 mg via INTRAMUSCULAR

## 2020-11-18 NOTE — Progress Notes (Signed)
Testosterone IM Injection  Due to Hypogonadism patient is present today for a Testosterone Injection.  Medication: Testosterone Cypionate Dose: 1mg   Location: right upper outer buttocks Lot: Exp:01/2023  Patient tolerated well, no complications were noted  Preformed by: 02/2023 CMA  Follow up: Follow up 2 weeks.

## 2020-12-02 ENCOUNTER — Other Ambulatory Visit: Payer: Self-pay

## 2020-12-02 ENCOUNTER — Other Ambulatory Visit: Payer: Self-pay | Admitting: Family Medicine

## 2020-12-02 ENCOUNTER — Ambulatory Visit (INDEPENDENT_AMBULATORY_CARE_PROVIDER_SITE_OTHER): Payer: BC Managed Care – PPO | Admitting: Family Medicine

## 2020-12-02 ENCOUNTER — Other Ambulatory Visit: Payer: Self-pay | Admitting: Urology

## 2020-12-02 DIAGNOSIS — E291 Testicular hypofunction: Secondary | ICD-10-CM | POA: Diagnosis not present

## 2020-12-02 DIAGNOSIS — N5201 Erectile dysfunction due to arterial insufficiency: Secondary | ICD-10-CM

## 2020-12-02 MED ORDER — TESTOSTERONE CYPIONATE 200 MG/ML IM SOLN: INTRAMUSCULAR | 0 refills | Status: DC

## 2020-12-02 MED ORDER — TESTOSTERONE CYPIONATE 200 MG/ML IM SOLN
200.0000 mg | Freq: Once | INTRAMUSCULAR | Status: AC
  Administered 2020-12-02: 200 mg via INTRAMUSCULAR

## 2020-12-02 NOTE — Progress Notes (Signed)
Testosterone IM Injection  Due to Hypogonadism patient is present today for a Testosterone Injection.  Medication: Testosterone Cypionate Dose: 67ml Location: right upper outer buttocks Lot: [id number] Exp:01/25/2023  Patient tolerated well, no complications were noted  Preformed by: Teressa Lower, CMA  Follow up: 2 weeks

## 2020-12-16 ENCOUNTER — Other Ambulatory Visit: Payer: Self-pay

## 2020-12-16 ENCOUNTER — Ambulatory Visit (INDEPENDENT_AMBULATORY_CARE_PROVIDER_SITE_OTHER): Payer: BC Managed Care – PPO | Admitting: *Deleted

## 2020-12-16 DIAGNOSIS — E291 Testicular hypofunction: Secondary | ICD-10-CM

## 2020-12-16 MED ORDER — TESTOSTERONE CYPIONATE 200 MG/ML IM SOLN
200.0000 mg | Freq: Once | INTRAMUSCULAR | Status: AC
  Administered 2020-12-16: 200 mg via INTRAMUSCULAR

## 2020-12-16 NOTE — Progress Notes (Signed)
Testosterone IM Injection  Due to Hypogonadism patient is present today for a Testosterone Injection.  Medication: Testosterone Cypionate Dose: 35ml Location: left upper outer buttocks Lot: [id number] Exp:02/24/23  Patient tolerated well, no complications were noted  Performed by: Milas Kocher, CMA & Eligha Bridegroom, CMA  Follow up: 2 weeks

## 2020-12-30 ENCOUNTER — Ambulatory Visit: Payer: BC Managed Care – PPO

## 2020-12-30 ENCOUNTER — Other Ambulatory Visit: Payer: Self-pay

## 2020-12-30 DIAGNOSIS — E291 Testicular hypofunction: Secondary | ICD-10-CM

## 2020-12-30 MED ORDER — TESTOSTERONE CYPIONATE 200 MG/ML IM SOLN: INTRAMUSCULAR | 0 refills | Status: DC

## 2020-12-30 MED ORDER — TESTOSTERONE CYPIONATE 200 MG/ML IM SOLN
200.0000 mg | Freq: Once | INTRAMUSCULAR | Status: AC
  Administered 2020-12-30: 200 mg via INTRAMUSCULAR

## 2020-12-30 NOTE — Progress Notes (Signed)
Testosterone IM Injection  Due to Hypogonadism patient is present today for a Testosterone Injection.  Medication: Testosterone Cypionate Dose: 86mL Location: right upper outer buttocks Lot: [id number] Exp:01/2023  Patient tolerated well, no complications were noted  Performed by: Franchot Erichsen, CMA  Follow up: RTC in 2 weeks for next testosterone. Testosterone refill requested.

## 2021-01-13 ENCOUNTER — Ambulatory Visit (INDEPENDENT_AMBULATORY_CARE_PROVIDER_SITE_OTHER): Payer: BC Managed Care – PPO | Admitting: *Deleted

## 2021-01-13 ENCOUNTER — Other Ambulatory Visit: Payer: Self-pay

## 2021-01-13 DIAGNOSIS — E291 Testicular hypofunction: Secondary | ICD-10-CM | POA: Diagnosis not present

## 2021-01-13 MED ORDER — TESTOSTERONE CYPIONATE 200 MG/ML IM SOLN
200.0000 mg | Freq: Once | INTRAMUSCULAR | Status: AC
  Administered 2021-01-13: 200 mg via INTRAMUSCULAR

## 2021-01-13 NOTE — Progress Notes (Signed)
Testosterone IM Injection   Due to Hypogonadism patient is present today for a Testosterone Injection.   Medication: Testosterone Cypionate Dose: 1ml Location: right upper outer buttocks Lot: [id number] Exp:06/24/2023   Patient tolerated well, no complications were noted   Preformed by: Taila Basinski CMA       Follow up: 2 weeks. 

## 2021-01-27 ENCOUNTER — Ambulatory Visit (INDEPENDENT_AMBULATORY_CARE_PROVIDER_SITE_OTHER): Payer: BC Managed Care – PPO | Admitting: *Deleted

## 2021-01-27 ENCOUNTER — Other Ambulatory Visit: Payer: Self-pay

## 2021-01-27 DIAGNOSIS — E291 Testicular hypofunction: Secondary | ICD-10-CM

## 2021-01-27 MED ORDER — TESTOSTERONE CYPIONATE 200 MG/ML IM SOLN
200.0000 mg | Freq: Once | INTRAMUSCULAR | Status: AC
  Administered 2021-01-27: 200 mg via INTRAMUSCULAR

## 2021-01-27 NOTE — Progress Notes (Signed)
Testosterone IM Injection   Due to Hypogonadism patient is present today for a Testosterone Injection.   Medication: Testosterone Cypionate Dose: 70ml Location: right upper outer buttocks Lot: [id number] Exp:06/24/2023   Patient tolerated well, no complications were noted   Preformed by: Ples Specter CMA       Follow up: 2 weeks.

## 2021-02-10 ENCOUNTER — Ambulatory Visit (INDEPENDENT_AMBULATORY_CARE_PROVIDER_SITE_OTHER): Payer: BC Managed Care – PPO | Admitting: *Deleted

## 2021-02-10 ENCOUNTER — Other Ambulatory Visit: Payer: Self-pay

## 2021-02-10 DIAGNOSIS — E291 Testicular hypofunction: Secondary | ICD-10-CM | POA: Diagnosis not present

## 2021-02-10 MED ORDER — TESTOSTERONE CYPIONATE 200 MG/ML IM SOLN
200.0000 mg | Freq: Once | INTRAMUSCULAR | Status: AC
  Administered 2021-02-10: 200 mg via INTRAMUSCULAR

## 2021-02-10 NOTE — Progress Notes (Signed)
Patient ID: George Bush, male   DOB: [birth year], 55 y.o.   MRN: [id number] Testosterone IM Injection  Due to Hypogonadism patient is present today for a Testosterone Injection.  Medication: Testosterone Cypionate Dose: 81ml Location: left upper outer buttocks Lot: [id number] Exp:05/28/2023  Patient tolerated well, no complications were noted  Performed by: Mervin Hack, CMA  Follow up: 2 weeks

## 2021-02-24 ENCOUNTER — Ambulatory Visit (INDEPENDENT_AMBULATORY_CARE_PROVIDER_SITE_OTHER): Payer: BC Managed Care – PPO | Admitting: *Deleted

## 2021-02-24 ENCOUNTER — Other Ambulatory Visit: Payer: Self-pay

## 2021-02-24 DIAGNOSIS — E291 Testicular hypofunction: Secondary | ICD-10-CM | POA: Diagnosis not present

## 2021-02-24 MED ORDER — TESTOSTERONE CYPIONATE 200 MG/ML IM SOLN
200.0000 mg | Freq: Once | INTRAMUSCULAR | Status: AC
  Administered 2021-02-24: 200 mg via INTRAMUSCULAR

## 2021-02-24 NOTE — Progress Notes (Signed)
Testosterone IM Injection   Due to Hypogonadism patient is present today for a Testosterone Injection.   Medication: Testosterone Cypionate Dose: 25ml Location: left upper outer buttocks Lot: [id number] Exp:01/25/2023   Patient tolerated well, no complications were noted   Performed by: Ples Specter CMA   Follow up: 2 weeks

## 2021-03-02 ENCOUNTER — Ambulatory Visit: Payer: BC Managed Care – PPO

## 2021-03-10 ENCOUNTER — Ambulatory Visit: Payer: BC Managed Care – PPO

## 2021-03-13 ENCOUNTER — Ambulatory Visit (INDEPENDENT_AMBULATORY_CARE_PROVIDER_SITE_OTHER): Payer: BC Managed Care – PPO

## 2021-03-13 ENCOUNTER — Other Ambulatory Visit: Payer: Self-pay

## 2021-03-13 DIAGNOSIS — E291 Testicular hypofunction: Secondary | ICD-10-CM

## 2021-03-13 MED ORDER — TESTOSTERONE CYPIONATE 200 MG/ML IM SOLN
200.0000 mg | Freq: Once | INTRAMUSCULAR | Status: AC
  Administered 2021-03-13: 200 mg via INTRAMUSCULAR

## 2021-03-13 NOTE — Progress Notes (Signed)
Testosterone IM Injection  Due to Hypogonadism patient is present today for a Testosterone Injection.  Medication: Testosterone Cypionate Dose: 200mg /mL 69mL Location: right upper outer buttocks Lot: [id number] Exp:05/2023  Patient tolerated well, no complications were noted.  Performed by: 06/2023, CMA   Follow up: RTC in 2 weeks for testosterone injection

## 2021-03-27 ENCOUNTER — Ambulatory Visit: Payer: BC Managed Care – PPO

## 2021-03-30 ENCOUNTER — Other Ambulatory Visit: Payer: Self-pay

## 2021-03-30 ENCOUNTER — Ambulatory Visit (INDEPENDENT_AMBULATORY_CARE_PROVIDER_SITE_OTHER): Payer: BC Managed Care – PPO | Admitting: Family Medicine

## 2021-03-30 DIAGNOSIS — E291 Testicular hypofunction: Secondary | ICD-10-CM | POA: Diagnosis not present

## 2021-03-30 MED ORDER — TESTOSTERONE CYPIONATE 200 MG/ML IM SOLN
200.0000 mg | Freq: Once | INTRAMUSCULAR | Status: AC
Start: 2021-03-30 — End: 2021-03-30
  Administered 2021-03-30: 200 mg via INTRAMUSCULAR

## 2021-03-30 NOTE — Progress Notes (Signed)
Testosterone IM Injection  Due to Hypogonadism patient is present today for a Testosterone Injection.  Medication: Testosterone Cypionate Dose: 67ml Location: right upper outer buttocks Lot: [id number] Exp:05/2023  Patient tolerated well, no complications were noted  Performed by: Teressa Lower, CMA  Follow up:  as scheduled

## 2021-04-13 ENCOUNTER — Other Ambulatory Visit: Payer: Self-pay

## 2021-04-13 DIAGNOSIS — N5201 Erectile dysfunction due to arterial insufficiency: Secondary | ICD-10-CM

## 2021-04-13 DIAGNOSIS — E291 Testicular hypofunction: Secondary | ICD-10-CM

## 2021-04-14 ENCOUNTER — Other Ambulatory Visit: Payer: Self-pay

## 2021-04-14 ENCOUNTER — Other Ambulatory Visit: Payer: BC Managed Care – PPO

## 2021-04-14 ENCOUNTER — Other Ambulatory Visit: Payer: Self-pay | Admitting: *Deleted

## 2021-04-14 DIAGNOSIS — E291 Testicular hypofunction: Secondary | ICD-10-CM

## 2021-04-14 DIAGNOSIS — N5201 Erectile dysfunction due to arterial insufficiency: Secondary | ICD-10-CM

## 2021-04-14 MED ORDER — TADALAFIL 20 MG PO TABS: ORAL_TABLET | ORAL | 0 refills | Status: DC

## 2021-04-15 LAB — PSA: Prostate Specific Ag, Serum: 0.5 ng/mL (ref 0.0–4.0)

## 2021-04-15 LAB — HEMATOCRIT: Hematocrit: 41.7 % (ref 37.5–51.0)

## 2021-04-15 LAB — TESTOSTERONE: Testosterone: 209 ng/dL — ABNORMAL LOW (ref 264–916)

## 2021-04-16 ENCOUNTER — Encounter: Payer: Self-pay | Admitting: Urology

## 2021-04-16 ENCOUNTER — Other Ambulatory Visit: Payer: Self-pay

## 2021-04-16 ENCOUNTER — Ambulatory Visit (INDEPENDENT_AMBULATORY_CARE_PROVIDER_SITE_OTHER): Payer: BC Managed Care – PPO | Admitting: Urology

## 2021-04-16 VITALS — BP 132/73 | HR 98 | Ht 71.0 in | Wt 250.0 lb

## 2021-04-16 DIAGNOSIS — E291 Testicular hypofunction: Secondary | ICD-10-CM | POA: Diagnosis not present

## 2021-04-16 DIAGNOSIS — N5201 Erectile dysfunction due to arterial insufficiency: Secondary | ICD-10-CM

## 2021-04-16 MED ORDER — TESTOSTERONE CYPIONATE 200 MG/ML IM SOLN: INTRAMUSCULAR | 0 refills | Status: DC

## 2021-04-16 NOTE — Progress Notes (Signed)
04/16/2021 9:04 AM   George Bush October 05, 1965 [id number]  Referring provider: Aldean Jewett, MD [address] [address],  [place] [address]  Chief Complaint  Patient presents with   Hypogonadism    Urologic history:  1.  Hypogonadism Symptoms tiredness, fatigue, decreased libido Testosterone cypionate 200 mg every 2 weeks  HPI: 55 y.o. male presents for annual follow-up.  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 Was placed on additional blood pressure medication and his ED has worsened He request Rx for vacuum erection device   PMH: Past Medical History:  Diagnosis Date   HIV (human immunodeficiency virus infection) ([place])     Surgical History: Past Surgical History:  Procedure Laterality Date   DISTAL BICEPS TENDON REPAIR Right 03/20/2019   Procedure: DISTAL BICEPS TENDON REPAIR;  Surgeon: Leim Fabry, MD;  Location: [facility] ORS;  Service: Orthopedics;  Laterality: Right;   SKIN SURGERY      Home Medications:  Allergies as of 04/16/2021   No Known Allergies      Medication List        Accurate as of April 16, 2021  9:04 AM. If you have any questions, ask your nurse or doctor.          atorvastatin 40 MG tablet Commonly known as: LIPITOR Take 40 mg by mouth daily.   elvitegravir-cobicistat-emtricitabine-tenofovir 150-150-200-10 MG Tabs tablet Commonly known as: GENVOYA Take 1 tablet by mouth daily.   Fifty50 Glucose Meter 2.0 w/Device Kit by Other route once for 1 dose. Use as instructed which ever meter insurance will cover   lisinopril-hydrochlorothiazide 20-25 MG tablet Commonly known as: ZESTORETIC Take 1 tablet by mouth daily.   metFORMIN 1000 MG tablet Commonly known as: GLUCOPHAGE Take 1,000 mg by  mouth 2 (two) times daily.   Ozempic (1 MG/DOSE) 4 MG/3ML Sopn Generic drug: Semaglutide (1 MG/DOSE) INJECT $RemoveBeforeD'1MG'[id number]$  UNDER THE SKIN EVERY 7 DAYS   Precision QID Test test strip Generic drug: glucose blood Dispense 200 blood glucose test strips, ok to sub any brand preferred by insurance/patient, use up to 2/day   QC Aspirin 325 MG tablet Generic drug: aspirin Take 325 mg by mouth daily.   rosuvastatin 20 MG tablet Commonly known as: CRESTOR Take 20 mg by mouth daily.   tadalafil 20 MG tablet Commonly known as: CIALIS 1 tab as needed for ED   testosterone cypionate 200 MG/ML injection Commonly known as: DEPOTESTOSTERONE CYPIONATE INJECT 1ML INTO THE MUSCLE EVERY 14 DAYSAS DIRECTED   Tresiba FlexTouch 100 UNIT/ML FlexTouch Pen Generic drug: insulin degludec SMARTSIG:0.5 Milliliter(s) SUB-Q Daily   vitamin B-12 500 MCG tablet Commonly known as: CYANOCOBALAMIN Take 1,000 mcg by mouth daily.        Allergies: No Known Allergies  Family History: No family history on file.  Social History:  reports that he has never smoked. He has never used smokeless tobacco. He reports that he does not drink alcohol and does not use drugs.   Physical Exam: BP 132/73    Pulse 98    Ht $R'5\' 11"'WK$  (1.803 m)    Wt 250 lb (113.4 kg)    BMI 34.87 kg/m   Constitutional:  Alert and oriented, No acute distress. HEENT: [place] AT, moist mucus membranes.  Trachea midline, no masses. Cardiovascular: No clubbing, cyanosis,  or edema. Respiratory: Normal respiratory effort, no increased work of breathing. Psychiatric: Normal mood and affect.  Assessment & Plan:    1.  Hypogonadism Most recent testosterone level was low however it had been a little over 2 weeks after his last injection He will restart injections in March.  He was informed that his pharmacy used to do testosterone injections and he will check 44-month lab visit for testosterone/hematocrit and 1 year office visit for DRE,  testosterone/hematocrit/PSA scheduled  2.  Erectile dysfunction Worsening ED related to medication Given Rx vacuum erection device along with website information to order   Abbie Sons, MD  [facility] [address] [place], [place] [address] [phone]

## 2021-05-07 ENCOUNTER — Other Ambulatory Visit: Payer: Self-pay

## 2021-05-11 ENCOUNTER — Ambulatory Visit: Payer: Self-pay | Admitting: Urology

## 2021-07-27 ENCOUNTER — Telehealth: Payer: Self-pay | Admitting: Urology

## 2021-07-27 DIAGNOSIS — E291 Testicular hypofunction: Secondary | ICD-10-CM

## 2021-07-27 MED ORDER — TESTOSTERONE CYPIONATE 200 MG/ML IM SOLN: INTRAMUSCULAR | 1 refills | Status: DC

## 2021-07-27 NOTE — Telephone Encounter (Signed)
Patient called and wants to restart his injections. He has not been seen since 2022. He cx his last lab and f/up appt in Jan. 2023. Can you take look and let me know what he needs first. ? ?George Bush ?

## 2021-07-27 NOTE — Telephone Encounter (Signed)
Rx sent.  Needs a follow-up office visit with testosterone, PSA and hematocrit in 6 months ?

## 2021-08-07 ENCOUNTER — Other Ambulatory Visit: Payer: Self-pay | Admitting: Urology

## 2021-08-07 DIAGNOSIS — N5201 Erectile dysfunction due to arterial insufficiency: Secondary | ICD-10-CM

## 2021-10-16 ENCOUNTER — Other Ambulatory Visit: Payer: BC Managed Care – PPO

## 2021-11-20 ENCOUNTER — Ambulatory Visit (INDEPENDENT_AMBULATORY_CARE_PROVIDER_SITE_OTHER): Payer: BC Managed Care – PPO | Admitting: Urology

## 2021-11-20 DIAGNOSIS — E291 Testicular hypofunction: Secondary | ICD-10-CM

## 2021-11-20 MED ORDER — TESTOSTERONE CYPIONATE 200 MG/ML IM SOLN
200.0000 mg | Freq: Once | INTRAMUSCULAR | Status: AC
  Administered 2021-11-20: 200 mg via INTRAMUSCULAR

## 2021-11-20 NOTE — Progress Notes (Unsigned)
Testosterone IM Injection   Due to Hypogonadism patient is present today for a Testosterone Injection.   Medication: Testosterone Cypionate Dose: 46ml Location: right upper outer buttocks Lot: [id number] Exp:06/2024   Patient tolerated well, no complications were noted   Performed by:  Ples Specter CMA   Follow up:  return in 2 weeks

## 2021-11-22 ENCOUNTER — Encounter: Payer: Self-pay | Admitting: Urology

## 2021-11-22 NOTE — Progress Notes (Signed)
I was present and available

## 2021-12-03 ENCOUNTER — Ambulatory Visit (INDEPENDENT_AMBULATORY_CARE_PROVIDER_SITE_OTHER): Payer: BC Managed Care – PPO | Admitting: Physician Assistant

## 2021-12-03 DIAGNOSIS — E291 Testicular hypofunction: Secondary | ICD-10-CM | POA: Diagnosis not present

## 2021-12-03 MED ORDER — TESTOSTERONE CYPIONATE 200 MG/ML IM SOLN
200.0000 mg | Freq: Once | INTRAMUSCULAR | Status: AC
  Administered 2021-12-03: 200 mg via INTRAMUSCULAR

## 2021-12-03 NOTE — Progress Notes (Signed)
Testosterone IM Injection  Due to Hypogonadism patient is present today for a Testosterone Injection.  Medication: Testosterone Cypionate Dose: 68mL Location: left upper outer buttocks Lot: [id number] Exp:06/2024  Patient tolerated well, no complications were noted  Performed by: Franchot Erichsen CMA  Follow up: RTC in 2 weeks for next injection.

## 2021-12-04 ENCOUNTER — Ambulatory Visit: Payer: BC Managed Care – PPO | Admitting: Physician Assistant

## 2021-12-17 ENCOUNTER — Ambulatory Visit (INDEPENDENT_AMBULATORY_CARE_PROVIDER_SITE_OTHER): Payer: BC Managed Care – PPO | Admitting: Physician Assistant

## 2021-12-17 DIAGNOSIS — E291 Testicular hypofunction: Secondary | ICD-10-CM

## 2021-12-17 MED ORDER — TESTOSTERONE CYPIONATE 200 MG/ML IM SOLN
200.0000 mg | Freq: Once | INTRAMUSCULAR | Status: AC
  Administered 2021-12-17: 200 mg via INTRAMUSCULAR

## 2021-12-17 NOTE — Progress Notes (Signed)
Testosterone IM Injection  Due to Hypogonadism patient is present today for a Testosterone Injection.  Medication: Testosterone Cypionate Dose: 33ml Location: right upper outer buttocks Lot: [id number] Exp:03/26  Patient tolerated well, no complications were noted  Performed by: Milas Kocher, CMA  Follow up: Two weeks

## 2021-12-31 ENCOUNTER — Ambulatory Visit (INDEPENDENT_AMBULATORY_CARE_PROVIDER_SITE_OTHER): Payer: BC Managed Care – PPO | Admitting: Physician Assistant

## 2021-12-31 DIAGNOSIS — N5201 Erectile dysfunction due to arterial insufficiency: Secondary | ICD-10-CM

## 2021-12-31 DIAGNOSIS — E291 Testicular hypofunction: Secondary | ICD-10-CM | POA: Diagnosis not present

## 2021-12-31 MED ORDER — "BD ECLIPSE NEEDLE 21G X 1-1/2"" MISC": 1 refills | Status: AC

## 2021-12-31 MED ORDER — "BD ECLIPSE SHIELDED NEEDLE 18G X 1-1/2"" MISC": 1 refills | Status: AC

## 2021-12-31 MED ORDER — TESTOSTERONE CYPIONATE 200 MG/ML IM SOLN
200.0000 mg | Freq: Once | INTRAMUSCULAR | Status: AC
  Administered 2021-12-31: 200 mg via INTRAMUSCULAR

## 2021-12-31 MED ORDER — SYRINGE 2-3 ML 3 ML MISC: 2 refills | Status: AC

## 2021-12-31 NOTE — Patient Instructions (Signed)

## 2021-12-31 NOTE — Progress Notes (Signed)
Testosterone IM Injection  Due to Hypogonadism patient is present today for a Testosterone Injection.  Medication: Testosterone Cypionate Dose: 200MG /1ML Location: right upper outer buttocks Lot: Exp:03/27/2023  Patient tolerated well.  Performed by: Sheronica Corey,CMA  Additional notes: Patient would like to start self-injecting at home and remembers prior instructions. Significant other also familiar with the process and was shown where on the buttocks to inject. Medication returned to the patient, syringes and needles ordered to his pharmacy, sharps disposal recommendations reviewed, written IM injection instructions provided. 14/04/2022, PA-C   Follow up: PRN   I spent 12 minutes on the day of the encounter to include pre-visit record review, face-to-face time with the patient, and post-visit ordering of tests.

## 2022-01-22 ENCOUNTER — Encounter: Payer: Self-pay | Admitting: Urology

## 2022-02-03 ENCOUNTER — Other Ambulatory Visit: Payer: Self-pay | Admitting: Urology

## 2022-02-03 DIAGNOSIS — N5201 Erectile dysfunction due to arterial insufficiency: Secondary | ICD-10-CM

## 2022-02-25 ENCOUNTER — Other Ambulatory Visit: Payer: Self-pay | Admitting: Urology

## 2022-02-25 DIAGNOSIS — E291 Testicular hypofunction: Secondary | ICD-10-CM

## 2022-04-15 ENCOUNTER — Other Ambulatory Visit: Payer: BC Managed Care – PPO

## 2022-04-15 DIAGNOSIS — E291 Testicular hypofunction: Secondary | ICD-10-CM

## 2022-04-16 ENCOUNTER — Other Ambulatory Visit: Payer: BC Managed Care – PPO

## 2022-04-16 LAB — TESTOSTERONE: Testosterone: 219 ng/dL — ABNORMAL LOW (ref 264–916)

## 2022-04-16 LAB — PSA: Prostate Specific Ag, Serum: 0.5 ng/mL (ref 0.0–4.0)

## 2022-04-16 LAB — HEMATOCRIT: Hematocrit: 37 % — ABNORMAL LOW (ref 37.5–51.0)

## 2022-04-21 ENCOUNTER — Ambulatory Visit: Payer: BC Managed Care – PPO | Admitting: Urology

## 2022-04-22 ENCOUNTER — Ambulatory Visit: Payer: BC Managed Care – PPO | Admitting: Urology

## 2022-04-28 ENCOUNTER — Encounter: Payer: Self-pay | Admitting: Urology

## 2022-04-28 ENCOUNTER — Ambulatory Visit (INDEPENDENT_AMBULATORY_CARE_PROVIDER_SITE_OTHER): Payer: BC Managed Care – PPO | Admitting: Urology

## 2022-04-28 VITALS — BP 138/83 | HR 88 | Ht 70.0 in | Wt 253.0 lb

## 2022-04-28 DIAGNOSIS — N5201 Erectile dysfunction due to arterial insufficiency: Secondary | ICD-10-CM

## 2022-04-28 DIAGNOSIS — E291 Testicular hypofunction: Secondary | ICD-10-CM | POA: Diagnosis not present

## 2022-04-28 NOTE — Progress Notes (Signed)
04/28/2022 8:28 AM   Delice Bison [birth year] [id number]  Referring provider: Aldean Jewett, MD No address on file  Chief Complaint  Patient presents with   Hypogonadism    Urologic history:  1.  Hypogonadism Symptoms tiredness, fatigue, decreased libido Testosterone cypionate 200 mg every 2 weeks  HPI: 57 y.o. male presents for annual follow-up.  Labs 04/15/2022: Testosterone 219, Hematocrit 37, PSA 0.5 Just restarted TRT ~ 2 months ago.  States last injection prior to this blood draw was ~ 2 weeks Tadalafil 20 mg has been effective for ED   PMH: Past Medical History:  Diagnosis Date   HIV (human immunodeficiency virus infection) ([place])     Surgical History: Past Surgical History:  Procedure Laterality Date   DISTAL BICEPS TENDON REPAIR Right 03/20/2019   Procedure: DISTAL BICEPS TENDON REPAIR;  Surgeon: Leim Fabry, MD;  Location: [facility] ORS;  Service: Orthopedics;  Laterality: Right;   SKIN SURGERY      Home Medications:  Allergies as of 04/28/2022   No Known Allergies      Medication List        Accurate as of April 28, 2022  8:28 AM. If you have any questions, ask your nurse or doctor.          2-3CC SYRINGE 3 ML Misc Use one syringe every 2 weeks with testosterone injection.   atorvastatin 40 MG tablet Commonly known as: LIPITOR Take 40 mg by mouth daily.   BD Eclipse Needle 21G X 1-1/2" Misc Generic drug: NEEDLE (DISP) 21 G Use one needle every 2 weeks to inject testosterone into the muscle.   BD Eclipse Shielded Needle 18G X 1-1/2" Misc Generic drug: NEEDLE (DISP) 18 G Use one needle every 2 weeks to draw the medication up into the syringe prior to injection.   elvitegravir-cobicistat-emtricitabine-tenofovir 150-150-200-10 MG Tabs tablet Commonly known as: GENVOYA Take 1 tablet by mouth daily.   Fifty50 Glucose Meter 2.0 w/Device Kit by Other route once for 1 dose. Use as instructed which ever meter insurance will  cover   lisinopril-hydrochlorothiazide 20-25 MG tablet Commonly known as: ZESTORETIC Take 1 tablet by mouth daily.   metFORMIN 1000 MG tablet Commonly known as: GLUCOPHAGE Take 1,000 mg by mouth 2 (two) times daily.   Ozempic (1 MG/DOSE) 4 MG/3ML Sopn Generic drug: Semaglutide (1 MG/DOSE) INJECT 1MG UNDER THE SKIN EVERY 7 DAYS   Precision QID Test test strip Generic drug: glucose blood Dispense 200 blood glucose test strips, ok to sub any brand preferred by insurance/patient, use up to 2/day   QC Aspirin 325 MG tablet Generic drug: aspirin Take 325 mg by mouth daily.   rosuvastatin 20 MG tablet Commonly known as: CRESTOR Take 20 mg by mouth daily.   tadalafil 20 MG tablet Commonly known as: CIALIS TAKE ONE TABLET BY MOUTH DAILY AS NEEDED FOR ERECTILE DYSFUNCTION   testosterone cypionate 200 MG/ML injection Commonly known as: DEPOTESTOSTERONE CYPIONATE INJECT 1ML INTO THE MUSCLE EVERY 14 DAYSAS DIRECTED   Tresiba FlexTouch 100 UNIT/ML FlexTouch Pen Generic drug: insulin degludec SMARTSIG:0.5 Milliliter(s) SUB-Q Daily   vitamin B-12 500 MCG tablet Commonly known as: CYANOCOBALAMIN Take 1,000 mcg by mouth daily.        Allergies: No Known Allergies  Family History: No family history on file.  Social History:  reports that he has never smoked. He has never used smokeless tobacco. He reports that he does not drink alcohol and does not use drugs.   Physical Exam: BP 138/83  Pulse 88   Ht _0  (1.778 m)   Wt 253 lb (114.8 kg)   BMI 36.30 kg/m   Constitutional:  Alert and oriented, No acute distress. HEENT: [place] AT, moist mucus membranes.  Trachea midline, no masses. Cardiovascular: No clubbing, cyanosis, or edema. Respiratory: Normal respiratory effort, no increased work of breathing. Psychiatric: Normal mood and affect.  Assessment & Plan:    1.  Hypogonadism Doing well on TRT Lab visit 6 months testosterone, hematocrit Office visit 1 year  testosterone, hematocrit, PSA  2.  Erectile dysfunction Stable on tadalafil   Abbie Sons, MD  [facility] [address] [place], [place] [address] [phone]

## 2022-05-17 ENCOUNTER — Other Ambulatory Visit: Payer: Self-pay | Admitting: Urology

## 2022-05-17 DIAGNOSIS — N5201 Erectile dysfunction due to arterial insufficiency: Secondary | ICD-10-CM

## 2022-06-04 ENCOUNTER — Other Ambulatory Visit: Payer: Self-pay | Admitting: Urology

## 2022-06-04 DIAGNOSIS — E291 Testicular hypofunction: Secondary | ICD-10-CM

## 2022-08-17 ENCOUNTER — Other Ambulatory Visit: Payer: Self-pay | Admitting: Urology

## 2022-08-17 DIAGNOSIS — N5201 Erectile dysfunction due to arterial insufficiency: Secondary | ICD-10-CM

## 2022-10-25 ENCOUNTER — Other Ambulatory Visit: Payer: Self-pay | Admitting: *Deleted

## 2022-10-25 DIAGNOSIS — E291 Testicular hypofunction: Secondary | ICD-10-CM

## 2022-10-27 ENCOUNTER — Other Ambulatory Visit: Payer: BC Managed Care – PPO

## 2022-10-27 DIAGNOSIS — E291 Testicular hypofunction: Secondary | ICD-10-CM

## 2022-10-28 LAB — HEMATOCRIT: Hematocrit: 37.2 % — ABNORMAL LOW (ref 37.5–51.0)

## 2022-10-29 LAB — TESTOSTERONE: Testosterone: 519 ng/dL (ref 264–916)

## 2022-11-29 ENCOUNTER — Telehealth: Payer: Self-pay | Admitting: Urology

## 2022-11-29 ENCOUNTER — Other Ambulatory Visit: Payer: Self-pay | Admitting: *Deleted

## 2022-11-29 DIAGNOSIS — N5201 Erectile dysfunction due to arterial insufficiency: Secondary | ICD-10-CM

## 2022-11-29 MED ORDER — TADALAFIL 20 MG PO TABS
ORAL_TABLET | ORAL | 0 refills | Status: DC
Start: 2022-11-29 — End: 2023-04-12

## 2022-11-29 NOTE — Telephone Encounter (Signed)
Medication sent today.

## 2022-11-29 NOTE — Telephone Encounter (Signed)
Patient called to request refill for Cialis 20 mg tablet. [facility] [address].

## 2022-12-02 ENCOUNTER — Other Ambulatory Visit: Payer: Self-pay | Admitting: Urology

## 2022-12-02 DIAGNOSIS — E291 Testicular hypofunction: Secondary | ICD-10-CM

## 2022-12-30 ENCOUNTER — Encounter: Payer: Self-pay | Admitting: Urology

## 2022-12-30 ENCOUNTER — Ambulatory Visit
Admission: RE | Admit: 2022-12-30 | Discharge: 2022-12-30 | Disposition: A | Discharge status: Home / Self Care | Payer: BC Managed Care – PPO | Source: Ambulatory Visit | Attending: Urology | Admitting: Urology

## 2022-12-30 ENCOUNTER — Ambulatory Visit (INDEPENDENT_AMBULATORY_CARE_PROVIDER_SITE_OTHER): Payer: BC Managed Care – PPO | Admitting: Urology

## 2022-12-30 ENCOUNTER — Ambulatory Visit
Admission: RE | Admit: 2022-12-30 | Discharge: 2022-12-30 | Disposition: A | Discharge status: Home / Self Care | Payer: BC Managed Care – PPO | Attending: Urology | Admitting: Urology

## 2022-12-30 VITALS — BP 116/78 | HR 74 | Ht 70.0 in | Wt 248.3 lb

## 2022-12-30 DIAGNOSIS — R3129 Other microscopic hematuria: Secondary | ICD-10-CM | POA: Diagnosis not present

## 2022-12-30 DIAGNOSIS — E291 Testicular hypofunction: Secondary | ICD-10-CM

## 2022-12-30 DIAGNOSIS — R102 Pelvic and perineal pain: Secondary | ICD-10-CM

## 2022-12-30 DIAGNOSIS — G8929 Other chronic pain: Secondary | ICD-10-CM

## 2022-12-30 DIAGNOSIS — N411 Chronic prostatitis: Secondary | ICD-10-CM

## 2022-12-30 LAB — URINALYSIS, COMPLETE
Bilirubin, UA: NEGATIVE
Glucose, UA: NEGATIVE
Ketones, UA: NEGATIVE
Leukocytes,UA: NEGATIVE
Nitrite, UA: NEGATIVE
Specific Gravity, UA: 1.025 (ref 1.005–1.030)
Urobilinogen, Ur: 0.2 mg/dL (ref 0.2–1.0)
pH, UA: 6 (ref 5.0–7.5)

## 2022-12-30 LAB — MICROSCOPIC EXAMINATION

## 2022-12-30 MED ORDER — TAMSULOSIN HCL 0.4 MG PO CAPS
0.4000 mg | ORAL_CAPSULE | Freq: Every day | ORAL | 11 refills | Status: AC
Start: 2022-12-30 — End: ?

## 2022-12-30 NOTE — Progress Notes (Unsigned)
George Bush,acting as a scribe for Riki Altes, MD.,have documented all relevant documentation on the behalf of Riki Altes, MD,as directed by  Riki Altes, MD while in the presence of Riki Altes, MD.' 12/30/2022 1:57 PM   George Bush [birth year] [id number]  Referring provider: Gale Journey, MD No address on file  Chief Complaint  Patient presents with   Follow-up    Urologic history:  1.  Hypogonadism Symptoms tiredness, fatigue, decreased libido Testosterone cypionate 200 mg every 2 weeks  HPI: 57 y.o. male called for acute visit for genital pain.  Followed for hypogonadism and erectile dysfunction. 1-2 week history of right perineal/groin pain and some decreased urinary pressure. No dysuria or gross hematuria.   PMH: Past Medical History:  Diagnosis Date   HIV (human immunodeficiency virus infection) (HCC)     Surgical History: Past Surgical History:  Procedure Laterality Date   DISTAL BICEPS TENDON REPAIR Right 03/20/2019   Procedure: DISTAL BICEPS TENDON REPAIR;  Surgeon: Signa Kell, MD;  Location: [facility] ORS;  Service: Orthopedics;  Laterality: Right;   SKIN SURGERY      Home Medications:  Allergies as of 12/30/2022   No Known Allergies      Medication List        Accurate as of December 30, 2022  1:57 PM. If you have any questions, ask your nurse or doctor.          2-3CC SYRINGE 3 ML Misc Use one syringe every 2 weeks with testosterone injection.   amLODipine 2.5 MG tablet Commonly known as: NORVASC Take 2.5 mg by mouth daily.   atorvastatin 40 MG tablet Commonly known as: LIPITOR Take 40 mg by mouth daily.   BD Eclipse Needle 21G X 1-1/2" Misc Generic drug: NEEDLE (DISP) 21 G Use one needle every 2 weeks to inject testosterone into the muscle.   BD Eclipse Shielded Needle 18G X 1-1/2" Misc Generic drug: NEEDLE (DISP) 18 G Use one needle every 2 weeks to draw the medication up into the syringe prior  to injection.   Dovato 50-300 MG tablet Generic drug: dolutegravir-lamiVUDine TAKE 1 TABLET BY MOUTH 1 TIME A DAY.   elvitegravir-cobicistat-emtricitabine-tenofovir 150-150-200-10 MG Tabs tablet Commonly known as: GENVOYA Take 1 tablet by mouth daily.   Fifty50 Glucose Meter 2.0 w/Device Kit by Other route once for 1 dose. Use as instructed which ever meter insurance will cover   HYDROcodone-acetaminophen 5-325 MG tablet Commonly known as: NORCO/VICODIN take 1-2 tablets every 4-6 hours as needed for postoperative pain   lisinopril-hydrochlorothiazide 20-25 MG tablet Commonly known as: ZESTORETIC Take 1 tablet by mouth daily.   metFORMIN 1000 MG tablet Commonly known as: GLUCOPHAGE Take 1,000 mg by mouth 2 (two) times daily.   Mounjaro 5 MG/0.5ML Pen Generic drug: tirzepatide Inject into the skin.   ondansetron 4 MG disintegrating tablet Commonly known as: ZOFRAN-ODT Place 1 tablet every 8 hours by translingual route as needed.   Ozempic (1 MG/DOSE) 4 MG/3ML Sopn Generic drug: Semaglutide (1 MG/DOSE) INJECT 1MG  UNDER THE SKIN EVERY 7 DAYS   Precision QID Test test strip Generic drug: glucose blood Dispense 200 blood glucose test strips, ok to sub any brand preferred by insurance/patient, use up to 2/day   QC Aspirin 325 MG tablet Generic drug: aspirin Take 325 mg by mouth daily.   rosuvastatin 20 MG tablet Commonly known as: CRESTOR Take 20 mg by mouth daily.   tadalafil 20 MG tablet Commonly known as:  CIALIS TAKE 1 TABLET BY MOUTH DAILY AS NEEDED FOR ERECTILE DYSFUNCTION   tamsulosin 0.4 MG Caps capsule Commonly known as: FLOMAX Take 1 capsule (0.4 mg total) by mouth daily. Started by: Riki Altes   testosterone cypionate 200 MG/ML injection Commonly known as: DEPOTESTOSTERONE CYPIONATE INJECT INTO THE MUSCLE EVERY 14 DAYSAS DIRECTED   Tresiba FlexTouch 100 UNIT/ML FlexTouch Pen Generic drug: insulin degludec SMARTSIG:0.5 Milliliter(s) SUB-Q  Daily   vitamin B-12 500 MCG tablet Commonly known as: CYANOCOBALAMIN Take 1,000 mcg by mouth daily.        Social History:  reports that he has never smoked. He has never used smokeless tobacco. He reports that he does not drink alcohol and does not use drugs.   Physical Exam: BP 116/78   Pulse 74   Ht 5\' 10"  (1.778 m)   Wt 248 lb 4.8 oz (112.6 kg)   BMI 35.63 kg/m   Constitutional:  Alert and oriented, No acute distress. HEENT: [place] AT Respiratory: Normal respiratory effort, no increased work of breathing. Psychiatric: Normal mood and affect. GU: Phallus without lesion, testes descended bilaterally without masses or tenderness, no inguinal hernia.   Laboratory Data:  Dipstick, trace blood/3+protein, microscopy 3/10 RBC.   Assessment & Plan:    1. Microhematuria KUB ordered. If no obvious stone, we discussed recommendation of microhematuria evaluation and we'll schedule CT urogram and cystoscopy.  2. Pelvic pain We discussed possible etiologies, including musculoskeletal pain, inflammatory prostatitis, and urinary calculi. Rx Tamsulosin 0.4 mg in pharmacy.  I have reviewed the above documentation for accuracy and completeness, and I agree with the above.   Riki Altes, MD  [facility] [address] [place], [place] [address] [phone]

## 2022-12-30 NOTE — Progress Notes (Signed)
error 

## 2022-12-31 ENCOUNTER — Encounter: Payer: Self-pay | Admitting: Urology

## 2023-01-03 ENCOUNTER — Telehealth: Payer: Self-pay

## 2023-01-03 DIAGNOSIS — N289 Disorder of kidney and ureter, unspecified: Secondary | ICD-10-CM

## 2023-01-03 NOTE — Group Note (Deleted)

## 2023-01-03 NOTE — Telephone Encounter (Signed)
Pt called left message on vm  Pt states he was seen on 9/5 and sent for an xray. He would like those results.  Pls advise.   Pt aware SCS not in clinic until Wed.

## 2023-01-06 NOTE — Telephone Encounter (Signed)
Pt called left message on vm  Pt states he was seen on 9/5 and sent for an xray. He would like those results.  Pls advise.   Pt aware SCS not in clinic until Wed.

## 2023-01-06 NOTE — Telephone Encounter (Signed)
Notified patient as instructed, patient pleased. Discussed follow-up appointments, patient agrees  

## 2023-01-06 NOTE — Addendum Note (Signed)
Addended by: Levada Schilling on: 01/06/2023 03:47 PM   Modules accepted: Orders

## 2023-01-06 NOTE — Telephone Encounter (Signed)
KUB reviewed and no stone is identified.  UA did show microhematuria and recommend further evaluation with a renal ultrasound and cystoscopy.  Please let patient know and placed RUS order/schedule cystoscopy

## 2023-01-06 NOTE — Telephone Encounter (Signed)
Renal scan ordered and cysto scheduled.

## 2023-01-17 ENCOUNTER — Ambulatory Visit
Admission: RE | Admit: 2023-01-17 | Discharge: 2023-01-17 | Disposition: A | Discharge status: Home / Self Care | Payer: BC Managed Care – PPO | Source: Ambulatory Visit | Attending: Urology | Admitting: Urology

## 2023-01-17 DIAGNOSIS — N289 Disorder of kidney and ureter, unspecified: Secondary | ICD-10-CM | POA: Insufficient documentation

## 2023-01-18 ENCOUNTER — Other Ambulatory Visit: Payer: Self-pay | Admitting: *Deleted

## 2023-01-18 DIAGNOSIS — N289 Disorder of kidney and ureter, unspecified: Secondary | ICD-10-CM

## 2023-01-18 DIAGNOSIS — N2889 Other specified disorders of kidney and ureter: Secondary | ICD-10-CM

## 2023-01-25 ENCOUNTER — Ambulatory Visit
Admission: RE | Admit: 2023-01-25 | Discharge: 2023-01-25 | Disposition: A | Discharge status: Home / Self Care | Payer: BC Managed Care – PPO | Source: Ambulatory Visit | Attending: Urology | Admitting: Urology

## 2023-01-25 DIAGNOSIS — N289 Disorder of kidney and ureter, unspecified: Secondary | ICD-10-CM | POA: Diagnosis present

## 2023-01-25 DIAGNOSIS — N2889 Other specified disorders of kidney and ureter: Secondary | ICD-10-CM | POA: Diagnosis present

## 2023-01-25 MED ORDER — GADOBUTROL 1 MMOL/ML IV SOLN
10.0000 mL | Freq: Once | INTRAVENOUS | Status: AC | PRN
  Administered 2023-01-25: 10 mL via INTRAVENOUS

## 2023-02-11 ENCOUNTER — Telehealth: Payer: Self-pay

## 2023-02-11 NOTE — Telephone Encounter (Signed)
Patient called in and states that he has had his U/S and MRI and he is under the impression he will not need his appt for his Cysto next week. If you will call him and let him know if he still needs to come in next Friday 10/25.

## 2023-02-11 NOTE — Telephone Encounter (Signed)
Patient advised that he still needs to keep his appointment for Cysto for work up of hematuria

## 2023-02-18 ENCOUNTER — Encounter: Payer: Self-pay | Admitting: Urology

## 2023-02-18 ENCOUNTER — Ambulatory Visit (INDEPENDENT_AMBULATORY_CARE_PROVIDER_SITE_OTHER): Payer: BC Managed Care – PPO | Admitting: Urology

## 2023-02-18 VITALS — BP 116/77 | HR 66 | Ht 72.0 in | Wt 248.0 lb

## 2023-02-18 DIAGNOSIS — N281 Cyst of kidney, acquired: Secondary | ICD-10-CM

## 2023-02-18 DIAGNOSIS — R3129 Other microscopic hematuria: Secondary | ICD-10-CM

## 2023-02-18 LAB — URINALYSIS, COMPLETE
Bilirubin, UA: NEGATIVE
Glucose, UA: NEGATIVE
Ketones, UA: NEGATIVE
Leukocytes,UA: NEGATIVE
Nitrite, UA: NEGATIVE
Specific Gravity, UA: 1.025 (ref 1.005–1.030)
Urobilinogen, Ur: 0.2 mg/dL (ref 0.2–1.0)
pH, UA: 6.5 (ref 5.0–7.5)

## 2023-02-18 LAB — MICROSCOPIC EXAMINATION: Bacteria, UA: NONE SEEN

## 2023-02-18 NOTE — Progress Notes (Unsigned)
   02/18/23  CC:  Chief Complaint  Patient presents with   Cysto    HPI:  Blood pressure 116/77, pulse 66, height 6' (1.829 m), weight 248 lb (112.5 kg). NED. A&Ox3.   No respiratory distress   Abd soft, NT, ND Normal phallus with bilateral descended testicles  Cystoscopy Procedure Note  Patient identification was confirmed, informed consent was obtained, and patient was prepped using Betadine solution.  Lidocaine jelly was administered per urethral meatus.     Pre-Procedure: - Inspection reveals a normal caliber ureteral meatus.  Procedure: The flexible cystoscope was introduced without difficulty - No urethral strictures/lesions are present. - {Blank multiple:19197::"Enlarged","Surgically absent","Normal"} prostate *** - {Blank multiple:19197::"Normal","Elevated","Tight"} bladder neck - Bilateral ureteral orifices identified - Bladder mucosa  reveals no ulcers, tumors, or lesions - No bladder stones - No trabeculation  Retroflexion shows ***   Post-Procedure: - Patient tolerated the procedure well  Assessment/ Plan:   No follow-ups on file.  Riki Altes, MD

## 2023-04-11 ENCOUNTER — Other Ambulatory Visit: Payer: Self-pay | Admitting: Urology

## 2023-04-11 DIAGNOSIS — N5201 Erectile dysfunction due to arterial insufficiency: Secondary | ICD-10-CM

## 2023-04-26 ENCOUNTER — Other Ambulatory Visit: Payer: Self-pay | Admitting: *Deleted

## 2023-04-26 DIAGNOSIS — R972 Elevated prostate specific antigen [PSA]: Secondary | ICD-10-CM

## 2023-04-26 DIAGNOSIS — E291 Testicular hypofunction: Secondary | ICD-10-CM

## 2023-04-29 ENCOUNTER — Other Ambulatory Visit: Payer: 59

## 2023-04-29 DIAGNOSIS — E291 Testicular hypofunction: Secondary | ICD-10-CM

## 2023-04-29 DIAGNOSIS — R972 Elevated prostate specific antigen [PSA]: Secondary | ICD-10-CM

## 2023-04-30 LAB — PSA: Prostate Specific Ag, Serum: 0.9 ng/mL (ref 0.0–4.0)

## 2023-04-30 LAB — HEMOGLOBIN AND HEMATOCRIT, BLOOD
Hematocrit: 38.9 % (ref 37.5–51.0)
Hemoglobin: 12.5 g/dL — ABNORMAL LOW (ref 13.0–17.7)

## 2023-04-30 LAB — TESTOSTERONE: Testosterone: 647 ng/dL (ref 264–916)

## 2023-05-02 ENCOUNTER — Ambulatory Visit: Payer: BC Managed Care – PPO | Admitting: Urology

## 2023-05-04 ENCOUNTER — Ambulatory Visit (INDEPENDENT_AMBULATORY_CARE_PROVIDER_SITE_OTHER): Payer: 59 | Admitting: Urology

## 2023-05-04 ENCOUNTER — Encounter: Payer: Self-pay | Admitting: Urology

## 2023-05-04 VITALS — BP 130/87 | HR 76 | Ht 70.0 in | Wt 250.0 lb

## 2023-05-04 DIAGNOSIS — R3129 Other microscopic hematuria: Secondary | ICD-10-CM | POA: Diagnosis not present

## 2023-05-04 DIAGNOSIS — Z125 Encounter for screening for malignant neoplasm of prostate: Secondary | ICD-10-CM

## 2023-05-04 DIAGNOSIS — N529 Male erectile dysfunction, unspecified: Secondary | ICD-10-CM

## 2023-05-04 DIAGNOSIS — E291 Testicular hypofunction: Secondary | ICD-10-CM | POA: Diagnosis not present

## 2023-05-04 NOTE — Progress Notes (Signed)
 I, Maysun LITTIE Griffiths, acting as a scribe for George JAYSON Barba, MD., have documented all relevant documentation on the behalf of George JAYSON Barba, MD, as directed by George JAYSON Barba, MD while in the presence of George JAYSON Barba, MD.  05/04/2023 9:29 AM   George Bush 02-Apr-1966 [id number]  Referring provider: Hope Dorothyann SQUIBB, MD No address on file  Chief Complaint  Patient presents with   Hypogonadism   Urologic history: 1.  Hypogonadism Symptoms tiredness, fatigue, decreased libido Testosterone  cypionate 200 mg every 2 weeks  2. Microhematuria Intermediate risk microhematuria diagnosed September 2024. 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  Cystoscopy remarkable for mild lateral lobe enlargement and no bladder mucosal abnormalities.   HPI: George Bush is a 59 y.o. male presents for annual follow-up.   No complaints since this last visit.  Denies dysuria, gross hematuria.  No flank, abdominal or pelvic pain.  Labs 04/29/2023 testosterone  level 647; PSA 0.9; and H/H 12.5/38.9   PSA trend   Prostate Specific Ag, Serum  Latest Ref Rng 0.0 - 4.0 ng/mL  11/01/2018 0.5   05/02/2019 0.5   05/09/2020 0.6   10/28/2020 1.0   04/14/2021 0.5   04/15/2022 0.5   04/29/2023 0.9      PMH: Past Medical History:  Diagnosis Date   HIV (human immunodeficiency virus infection) (HCC)     Surgical History: Past Surgical History:  Procedure Laterality Date   DISTAL BICEPS TENDON REPAIR Right 03/20/2019   Procedure: DISTAL BICEPS TENDON REPAIR;  Surgeon: Tobie Priest, MD;  Location: [facility] ORS;  Service: Orthopedics;  Laterality: Right;   SKIN SURGERY      Home Medications:  Allergies as of 05/04/2023   No Known Allergies      Medication List        Accurate as of May 04, 2023  9:29 AM. If you have any questions, ask your nurse or doctor.          2-3CC SYRINGE 3 ML Misc Use one  syringe every 2 weeks with testosterone  injection.   amLODipine 2.5 MG tablet Commonly known as: NORVASC Take 2.5 mg by mouth daily.   atorvastatin 40 MG tablet Commonly known as: LIPITOR Take 40 mg by mouth daily.   BD Eclipse Needle 21G X 1-1/2 Misc Generic drug: NEEDLE (DISP) 21 G Use one needle every 2 weeks to inject testosterone  into the muscle.   BD Eclipse Shielded Needle 18G X 1-1/2 Misc Generic drug: NEEDLE (DISP) 18 G Use one needle every 2 weeks to draw the medication up into the syringe prior to injection.   Dovato 50-300 MG tablet Generic drug: dolutegravir-lamiVUDine TAKE 1 TABLET BY MOUTH 1 TIME A DAY.   elvitegravir-cobicistat-emtricitabine-tenofovir 150-150-200-10 MG Tabs tablet Commonly known as: GENVOYA Take 1 tablet by mouth daily.   Fifty50 Glucose Meter 2.0 w/Device Kit by Other route once for 1 dose. Use as instructed which ever meter insurance will cover   HYDROcodone-acetaminophen  5-325 MG tablet Commonly known as: NORCO/VICODIN take 1-2 tablets every 4-6 hours as needed for postoperative pain   lisinopril-hydrochlorothiazide 20-25 MG tablet Commonly known as: ZESTORETIC Take 1 tablet by mouth daily.   metFORMIN 1000 MG tablet Commonly known as: GLUCOPHAGE Take 1,000 mg by mouth 2 (two) times daily.   Mounjaro 5 MG/0.5ML Pen Generic drug: tirzepatide Inject into the skin.   ondansetron  4 MG disintegrating tablet Commonly known as: ZOFRAN -ODT Place 1  tablet every 8 hours by translingual route as needed.   Ozempic (1 MG/DOSE) 4 MG/3ML Sopn Generic drug: Semaglutide (1 MG/DOSE) INJECT 1MG  UNDER THE SKIN EVERY 7 DAYS   Precision QID Test test strip Generic drug: glucose blood Dispense 200 blood glucose test strips, ok to sub any brand preferred by insurance/patient, use up to 2/day   QC Aspirin  325 MG tablet Generic drug: aspirin  Take 325 mg by mouth daily.   rosuvastatin 20 MG tablet Commonly known as: CRESTOR Take 20 mg by mouth  daily.   tadalafil  20 MG tablet Commonly known as: CIALIS  TAKE 1 TABLET BY MOUTH DAILY AS NEEDED FOR ERECTILE DYSFUNCTION   tamsulosin  0.4 MG Caps capsule Commonly known as: FLOMAX  Take 1 capsule (0.4 mg total) by mouth daily.   testosterone  cypionate 200 MG/ML injection Commonly known as: DEPOTESTOSTERONE CYPIONATE INJECT 1ML INTO THE MUSCLE EVERY 14 DAYSAS DIRECTED   Tresiba FlexTouch 100 UNIT/ML FlexTouch Pen Generic drug: insulin degludec SMARTSIG:0.5 Milliliter(s) SUB-Q Daily   vitamin B-12 500 MCG tablet Commonly known as: CYANOCOBALAMIN Take 1,000 mcg by mouth daily.        Allergies: No Known Allergies  Social History:  reports that he has never smoked. He has never used smokeless tobacco. He reports that he does not drink alcohol and does not use drugs.   Physical Exam: BP 130/87   Pulse 76   Ht 5' 10 (1.778 m)   Wt 250 lb (113.4 kg)   BMI 35.87 kg/m   Constitutional:  Alert and oriented, No acute distress. HEENT: [place] AT Respiratory: Normal respiratory effort, no increased work of breathing. Psychiatric: Normal mood and affect.   Assessment & Plan:    1. Hypogonadism Stable on TRT  2. Erectile dysfunction Stable on tadalafil .  3. Microhematuria UA today UA at 1 year follow-up visit.    I have reviewed the above documentation for accuracy and completeness, and I agree with the above.   George JAYSON Barba, MD  [facility] [address] [place], [place] [address] [phone]

## 2023-05-05 LAB — URINALYSIS, COMPLETE
Bilirubin, UA: NEGATIVE
Glucose, UA: NEGATIVE
Ketones, UA: NEGATIVE
Leukocytes,UA: NEGATIVE
Nitrite, UA: NEGATIVE
Specific Gravity, UA: 1.025 (ref 1.005–1.030)
Urobilinogen, Ur: 1 mg/dL (ref 0.2–1.0)
pH, UA: 7 (ref 5.0–7.5)

## 2023-05-05 LAB — MICROSCOPIC EXAMINATION: Bacteria, UA: NONE SEEN

## 2023-06-14 ENCOUNTER — Other Ambulatory Visit: Payer: Self-pay | Admitting: Urology

## 2023-06-14 DIAGNOSIS — E291 Testicular hypofunction: Secondary | ICD-10-CM

## 2023-08-09 ENCOUNTER — Other Ambulatory Visit: Payer: Self-pay | Admitting: Urology

## 2023-08-09 DIAGNOSIS — N5201 Erectile dysfunction due to arterial insufficiency: Secondary | ICD-10-CM

## 2023-11-01 ENCOUNTER — Other Ambulatory Visit: Payer: Self-pay

## 2023-11-01 DIAGNOSIS — E291 Testicular hypofunction: Secondary | ICD-10-CM

## 2023-11-02 LAB — HEMOGLOBIN AND HEMATOCRIT, BLOOD
Hematocrit: 35.1 % — ABNORMAL LOW (ref 37.5–51.0)
Hemoglobin: 11.7 g/dL — ABNORMAL LOW (ref 13.0–17.7)

## 2023-11-02 LAB — TESTOSTERONE: Testosterone: 560 ng/dL (ref 264–916)

## 2023-11-03 ENCOUNTER — Ambulatory Visit: Payer: Self-pay | Admitting: Urology

## 2023-11-08 ENCOUNTER — Other Ambulatory Visit: Payer: Self-pay | Admitting: Urology

## 2023-11-08 DIAGNOSIS — N5201 Erectile dysfunction due to arterial insufficiency: Secondary | ICD-10-CM

## 2024-02-02 ENCOUNTER — Other Ambulatory Visit: Payer: Self-pay | Admitting: Urology

## 2024-02-02 DIAGNOSIS — N5201 Erectile dysfunction due to arterial insufficiency: Secondary | ICD-10-CM

## 2024-04-24 ENCOUNTER — Other Ambulatory Visit: Payer: Self-pay

## 2024-04-24 DIAGNOSIS — R3129 Other microscopic hematuria: Secondary | ICD-10-CM

## 2024-05-02 ENCOUNTER — Other Ambulatory Visit

## 2024-05-02 DIAGNOSIS — R3129 Other microscopic hematuria: Secondary | ICD-10-CM

## 2024-05-02 DIAGNOSIS — Z125 Encounter for screening for malignant neoplasm of prostate: Secondary | ICD-10-CM

## 2024-05-02 DIAGNOSIS — E291 Testicular hypofunction: Secondary | ICD-10-CM

## 2024-05-02 LAB — MICROSCOPIC EXAMINATION

## 2024-05-02 LAB — URINALYSIS, COMPLETE
Bilirubin, UA: NEGATIVE
Glucose, UA: NEGATIVE
Ketones, UA: NEGATIVE
Leukocytes,UA: NEGATIVE
Nitrite, UA: NEGATIVE
Specific Gravity, UA: 1.025 (ref 1.005–1.030)
Urobilinogen, Ur: 0.2 mg/dL (ref 0.2–1.0)
pH, UA: 6.5 (ref 5.0–7.5)

## 2024-05-03 ENCOUNTER — Other Ambulatory Visit: Payer: Self-pay

## 2024-05-03 LAB — TESTOSTERONE: Testosterone: 196 ng/dL — ABNORMAL LOW (ref 264–916)

## 2024-05-03 LAB — HEMOGLOBIN AND HEMATOCRIT, BLOOD
Hematocrit: 36.6 % — ABNORMAL LOW (ref 37.5–51.0)
Hemoglobin: 11.9 g/dL — ABNORMAL LOW (ref 13.0–17.7)

## 2024-05-03 LAB — PSA: Prostate Specific Ag, Serum: 0.6 ng/mL (ref 0.0–4.0)

## 2024-05-04 ENCOUNTER — Ambulatory Visit: Payer: Self-pay | Admitting: Urology

## 2024-05-18 ENCOUNTER — Encounter: Payer: Self-pay | Admitting: Urology

## 2024-05-18 ENCOUNTER — Other Ambulatory Visit: Payer: Self-pay | Admitting: Urology

## 2024-05-18 ENCOUNTER — Ambulatory Visit: Admitting: Urology

## 2024-05-18 VITALS — BP 125/86 | HR 76 | Ht 70.0 in | Wt 230.0 lb

## 2024-05-18 DIAGNOSIS — E291 Testicular hypofunction: Secondary | ICD-10-CM

## 2024-05-18 DIAGNOSIS — Z125 Encounter for screening for malignant neoplasm of prostate: Secondary | ICD-10-CM

## 2024-05-18 MED ORDER — TESTOSTERONE CYPIONATE 200 MG/ML IM SOLN: INTRAMUSCULAR | 0 refills | Status: AC

## 2024-05-18 NOTE — Progress Notes (Signed)
 "  05/18/2024 8:09 AM   George Bush Lady [birth year] [id number]  Referring provider: Dann Ghent, MD [address] [place],  [place] [address]  Chief Complaint  Patient presents with   Hypogonadism   Urologic history: 1.  Hypogonadism Symptoms tiredness, fatigue, decreased libido Testosterone  cypionate 200 mg every 2 weeks   2. Microhematuria Intermediate risk microhematuria diagnosed September 2024. 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  Cystoscopy remarkable for mild lateral lobe enlargement and no bladder mucosal abnormalities.  HPI: George Bush is a 59 y.o. male who presents for annual follow-up.  No complaints since last visit Good energy level on TRT He states his wife gives his injections and misses doses on occasion Urologic ROS otherwise noncontributory Labs 05/02/2024: Testosterone  196 ng/dL, H/H 88.0/63.3, PSA 0.6  PMH: Past Medical History:  Diagnosis Date   HIV (human immunodeficiency virus infection) (HCC)     Surgical History: Past Surgical History:  Procedure Laterality Date   DISTAL BICEPS TENDON REPAIR Right 03/20/2019   Procedure: DISTAL BICEPS TENDON REPAIR;  Surgeon: Tobie Priest, MD;  Location: [facility] ORS;  Service: Orthopedics;  Laterality: Right;   SKIN SURGERY      Home Medications:  Allergies as of 05/18/2024   No Known Allergies      Medication List        Accurate as of May 18, 2024  8:09 AM. If you have any questions, ask your nurse or doctor.          2-3CC SYRINGE 3 ML Misc Use one syringe every 2 weeks with testosterone  injection.   amLODipine 2.5 MG tablet Commonly known as: NORVASC Take 2.5 mg by mouth daily.   atorvastatin 40 MG tablet Commonly known as: LIPITOR Take 40 mg by mouth daily.   BD Eclipse Needle 21G X 1-1/2 Misc Generic drug: NEEDLE (DISP) 21 G Use one needle every 2 weeks to inject testosterone  into the  muscle.   BD Eclipse Shielded Needle 18G X 1-1/2 Misc Generic drug: NEEDLE (DISP) 18 G Use one needle every 2 weeks to draw the medication up into the syringe prior to injection.   Dovato 50-300 MG tablet Generic drug: dolutegravir-lamiVUDine TAKE 1 TABLET BY MOUTH 1 TIME A DAY.   elvitegravir-cobicistat-emtricitabine-tenofovir 150-150-200-10 MG Tabs tablet Commonly known as: GENVOYA Take 1 tablet by mouth daily.   Fifty50 Glucose Meter 2.0 w/Device Kit by Other route once for 1 dose. Use as instructed which ever meter insurance will cover   HYDROcodone-acetaminophen  5-325 MG tablet Commonly known as: NORCO/VICODIN take 1-2 tablets every 4-6 hours as needed for postoperative pain   lisinopril-hydrochlorothiazide 20-25 MG tablet Commonly known as: ZESTORETIC Take 1 tablet by mouth daily.   metFORMIN 1000 MG tablet Commonly known as: GLUCOPHAGE Take 1,000 mg by mouth 2 (two) times daily.   Mounjaro 15 MG/0.5ML Pen Generic drug: tirzepatide What changed: Another medication with the same name was removed. Continue taking this medication, and follow the directions you see here. Changed by: Glendia Barba, MD   ondansetron  4 MG disintegrating tablet Commonly known as: ZOFRAN -ODT Place 1 tablet every 8 hours by translingual route as needed.   Ozempic (1 MG/DOSE) 4 MG/3ML Sopn Generic drug: Semaglutide (1 MG/DOSE) INJECT 1MG  UNDER THE SKIN EVERY 7 DAYS   Precision QID Test test strip Generic drug: glucose blood Dispense 200 blood glucose test strips, ok to sub any brand preferred by insurance/patient, use up to 2/day   QC  Aspirin  325 MG tablet Generic drug: aspirin  Take 325 mg by mouth daily.   rosuvastatin 20 MG tablet Commonly known as: CRESTOR Take 20 mg by mouth daily.   tadalafil  20 MG tablet Commonly known as: CIALIS  TAKE 1 TABLET BY MOUTH DAILY AS NEEDED FOR ERECTILE DYSFUNCTION   tamsulosin  0.4 MG Caps capsule Commonly known as: FLOMAX  Take 1 capsule (0.4  mg total) by mouth daily.   testosterone  cypionate 200 MG/ML injection Commonly known as: DEPOTESTOSTERONE CYPIONATE INJECT 1ML INTO THE MUSCLE EVERY 14 DAYSAS DIRECTED   Tresiba FlexTouch 100 UNIT/ML FlexTouch Pen Generic drug: insulin degludec SMARTSIG:0.5 Milliliter(s) SUB-Q Daily   vitamin B-12 500 MCG tablet Commonly known as: CYANOCOBALAMIN Take 1,000 mcg by mouth daily.        Allergies: Allergies[1]  Family History: No family history on file.  Social History:  reports that he has never smoked. He has never used smokeless tobacco. He reports that he does not drink alcohol and does not use drugs.   Physical Exam: BP 125/86   Pulse 76   Ht 5' 10 (1.778 m)   Wt 230 lb (104.3 kg)   BMI 33.00 kg/m   Constitutional:  Alert, No acute distress. HEENT: [place] AT Respiratory: Normal respiratory effort, no increased work of breathing. Psychiatric: Normal mood and affect.   Assessment & Plan:    1.  Hypogonadism Doing well on TRT Lab visit 6 months testosterone , H/H Office visit 1 year testosterone , H/H, PSA   Glendia JAYSON Barba, MD  [facility] [address] [place], [place] [address] [phone]    [1] No Known Allergies  "

## 2024-05-19 ENCOUNTER — Other Ambulatory Visit: Payer: Self-pay | Admitting: Urology

## 2024-05-19 DIAGNOSIS — N5201 Erectile dysfunction due to arterial insufficiency: Secondary | ICD-10-CM

## 2024-05-22 ENCOUNTER — Telehealth: Payer: Self-pay

## 2024-05-22 NOTE — Telephone Encounter (Signed)
 Fax is on my desk to do .

## 2024-05-22 NOTE — Telephone Encounter (Signed)
 Pt LM on triage T needs PA.  Pls contact pts insurance to proceed. Pt states [employer] [facility] advised him to contact office to initiate.    Pt aware will send to [facility].

## 2024-11-15 ENCOUNTER — Other Ambulatory Visit

## 2025-05-10 ENCOUNTER — Other Ambulatory Visit

## 2025-05-17 ENCOUNTER — Ambulatory Visit: Admitting: Urology
